# Patient Record
Sex: Female | Born: 1990 | Race: Black or African American | Hispanic: No | Marital: Married | Smoking: Never smoker
Health system: Southern US, Community
[De-identification: ages and names within clinical notes are randomized; demographics above are authoritative.]

## PROBLEM LIST (undated history)

## (undated) ENCOUNTER — Emergency Department (HOSPITAL_COMMUNITY): Admission: EM | Payer: 59 | Source: Home / Self Care

## (undated) ENCOUNTER — Inpatient Hospital Stay (HOSPITAL_COMMUNITY): Payer: Self-pay

## (undated) DIAGNOSIS — I890 Lymphedema, not elsewhere classified: Secondary | ICD-10-CM

## (undated) HISTORY — PX: NO PAST SURGERIES: SHX2092

---

## 2020-12-16 ENCOUNTER — Other Ambulatory Visit: Payer: Self-pay | Admitting: Obstetrics and Gynecology

## 2020-12-16 DIAGNOSIS — O3680X0 Pregnancy with inconclusive fetal viability, not applicable or unspecified: Secondary | ICD-10-CM

## 2020-12-23 ENCOUNTER — Other Ambulatory Visit: Payer: Self-pay

## 2020-12-23 ENCOUNTER — Ambulatory Visit
Admission: RE | Admit: 2020-12-23 | Discharge: 2020-12-23 | Disposition: A | Discharge status: Home / Self Care | Payer: 59 | Source: Ambulatory Visit | Attending: Obstetrics and Gynecology | Admitting: Obstetrics and Gynecology

## 2020-12-23 DIAGNOSIS — O3680X0 Pregnancy with inconclusive fetal viability, not applicable or unspecified: Secondary | ICD-10-CM | POA: Diagnosis not present

## 2021-01-11 ENCOUNTER — Other Ambulatory Visit: Payer: Self-pay | Admitting: Obstetrics and Gynecology

## 2021-01-11 DIAGNOSIS — O3680X0 Pregnancy with inconclusive fetal viability, not applicable or unspecified: Secondary | ICD-10-CM

## 2021-01-19 ENCOUNTER — Other Ambulatory Visit: Payer: Self-pay

## 2021-01-19 ENCOUNTER — Ambulatory Visit
Admission: RE | Admit: 2021-01-19 | Discharge: 2021-01-19 | Disposition: A | Discharge status: Home / Self Care | Payer: 59 | Source: Ambulatory Visit | Attending: Obstetrics and Gynecology | Admitting: Obstetrics and Gynecology

## 2021-01-19 DIAGNOSIS — O3680X0 Pregnancy with inconclusive fetal viability, not applicable or unspecified: Secondary | ICD-10-CM | POA: Diagnosis present

## 2021-02-01 LAB — OB RESULTS CONSOLE HEPATITIS B SURFACE ANTIGEN: Hepatitis B Surface Ag: NEGATIVE

## 2021-02-01 LAB — OB RESULTS CONSOLE GC/CHLAMYDIA
Chlamydia: NEGATIVE
Gonorrhea: NEGATIVE

## 2021-02-01 LAB — OB RESULTS CONSOLE ANTIBODY SCREEN: Antibody Screen: NEGATIVE

## 2021-02-01 LAB — OB RESULTS CONSOLE HIV ANTIBODY (ROUTINE TESTING): HIV: NONREACTIVE

## 2021-02-01 LAB — OB RESULTS CONSOLE RUBELLA ANTIBODY, IGM: Rubella: IMMUNE

## 2021-02-01 LAB — OB RESULTS CONSOLE ABO/RH: RH Type: POSITIVE

## 2021-02-01 LAB — HEPATITIS C ANTIBODY: HCV Ab: NEGATIVE

## 2021-06-02 LAB — OB RESULTS CONSOLE RPR: RPR: NONREACTIVE

## 2021-08-15 ENCOUNTER — Encounter (HOSPITAL_COMMUNITY): Payer: Self-pay | Admitting: Obstetrics and Gynecology

## 2021-08-15 ENCOUNTER — Inpatient Hospital Stay (HOSPITAL_COMMUNITY)
Admission: AD | Admit: 2021-08-15 | Discharge: 2021-08-15 | Disposition: A | Discharge status: Home / Self Care | Payer: 59 | Attending: Obstetrics and Gynecology | Admitting: Obstetrics and Gynecology

## 2021-08-15 ENCOUNTER — Other Ambulatory Visit: Payer: Self-pay

## 2021-08-15 DIAGNOSIS — Z3A39 39 weeks gestation of pregnancy: Secondary | ICD-10-CM | POA: Diagnosis not present

## 2021-08-15 DIAGNOSIS — O471 False labor at or after 37 completed weeks of gestation: Secondary | ICD-10-CM | POA: Diagnosis present

## 2021-08-15 HISTORY — DX: Lymphedema, not elsewhere classified: I89.0

## 2021-08-15 NOTE — MAU Provider Note (Signed)
History     Chief Complaint  Patient presents with   Contractions   30 yo F @ 39 5/[redacted] weeks gestation presents for labor check (+) ctx   OB History     Gravida  1   Para      Term      Preterm      AB      Living         SAB      IAB      Ectopic      Multiple      Live Births              No past medical history on file.   No family history on file.     Allergies: Not on File  No medications prior to admission.     Physical Exam   Blood pressure 112/88, pulse 91, SpO2 99 %.  VE per RN  3/70/-1 Tracing reviewed: baseline 135 (+) accel  165 Ctx q 2-5 mins ED Course  IMP: latent phase labor Term P) d/c home. Labor precautions MDM   Serita Kyle, MD 4:19 AM 08/15/2021

## 2021-08-15 NOTE — MAU Note (Signed)
Helen Shah is a 30 y.o. at [redacted]w[redacted]d here in MAU reporting: contractions that began at 43. Denies SROM, vaginal bleeding or bloody show. Endorses + fetal movement  Onset of complaint: 0022 Pain score:5 There were no vitals filed for this visit.   FHT 135 Lab orders placed from triage:  mau labor

## 2021-08-15 NOTE — Discharge Instructions (Signed)
Labor precautions

## 2021-08-15 NOTE — MAU Note (Signed)
Pt reports contractions feel less frequent but remain at the same level of intensity.

## 2021-08-20 ENCOUNTER — Inpatient Hospital Stay (HOSPITAL_COMMUNITY): Payer: 59 | Admitting: Anesthesiology

## 2021-08-20 ENCOUNTER — Encounter (HOSPITAL_COMMUNITY): Payer: Self-pay | Admitting: Obstetrics and Gynecology

## 2021-08-20 ENCOUNTER — Encounter (HOSPITAL_COMMUNITY)
Admission: AD | Disposition: A | Discharge status: Home / Self Care | Payer: Self-pay | Source: Home / Self Care | Attending: Obstetrics and Gynecology

## 2021-08-20 ENCOUNTER — Other Ambulatory Visit: Payer: Self-pay

## 2021-08-20 ENCOUNTER — Inpatient Hospital Stay (HOSPITAL_COMMUNITY)
Admission: RE | Admit: 2021-08-20 | Discharge: 2021-08-20 | Disposition: A | Discharge status: Home / Self Care | Payer: 59 | Source: Home / Self Care | Attending: Obstetrics and Gynecology | Admitting: Obstetrics and Gynecology

## 2021-08-20 ENCOUNTER — Inpatient Hospital Stay (HOSPITAL_COMMUNITY)
Admission: AD | Admit: 2021-08-20 | Discharge: 2021-08-22 | DRG: 786 | Disposition: A | Discharge status: Home / Self Care | Payer: 59 | Attending: Obstetrics and Gynecology | Admitting: Obstetrics and Gynecology

## 2021-08-20 DIAGNOSIS — O471 False labor at or after 37 completed weeks of gestation: Secondary | ICD-10-CM | POA: Insufficient documentation

## 2021-08-20 DIAGNOSIS — U071 COVID-19: Secondary | ICD-10-CM | POA: Diagnosis present

## 2021-08-20 DIAGNOSIS — O26893 Other specified pregnancy related conditions, third trimester: Secondary | ICD-10-CM | POA: Diagnosis present

## 2021-08-20 DIAGNOSIS — O9081 Anemia of the puerperium: Secondary | ICD-10-CM | POA: Diagnosis not present

## 2021-08-20 DIAGNOSIS — Z3A40 40 weeks gestation of pregnancy: Secondary | ICD-10-CM

## 2021-08-20 DIAGNOSIS — O480 Post-term pregnancy: Principal | ICD-10-CM | POA: Diagnosis present

## 2021-08-20 DIAGNOSIS — O9852 Other viral diseases complicating childbirth: Secondary | ICD-10-CM | POA: Diagnosis present

## 2021-08-20 DIAGNOSIS — O99214 Obesity complicating childbirth: Secondary | ICD-10-CM | POA: Diagnosis present

## 2021-08-20 DIAGNOSIS — D62 Acute posthemorrhagic anemia: Secondary | ICD-10-CM | POA: Diagnosis not present

## 2021-08-20 LAB — CBC
HCT: 40.5 % (ref 36.0–46.0)
Hemoglobin: 13.8 g/dL (ref 12.0–15.0)
MCH: 32.5 pg (ref 26.0–34.0)
MCHC: 34.1 g/dL (ref 30.0–36.0)
MCV: 95.5 fL (ref 80.0–100.0)
Platelets: 216 10*3/uL (ref 150–400)
RBC: 4.24 MIL/uL (ref 3.87–5.11)
RDW: 13.5 % (ref 11.5–15.5)
WBC: 13.4 10*3/uL — ABNORMAL HIGH (ref 4.0–10.5)
nRBC: 0 % (ref 0.0–0.2)

## 2021-08-20 LAB — TYPE AND SCREEN
ABO/RH(D): B POS
Antibody Screen: NEGATIVE

## 2021-08-20 LAB — RESP PANEL BY RT-PCR (FLU A&B, COVID) ARPGX2
Influenza A by PCR: NEGATIVE
Influenza B by PCR: NEGATIVE
SARS Coronavirus 2 by RT PCR: POSITIVE — AB

## 2021-08-20 SURGERY — Surgical Case
Anesthesia: Epidural

## 2021-08-20 MED ORDER — PHENYLEPHRINE 40 MCG/ML (10ML) SYRINGE FOR IV PUSH (FOR BLOOD PRESSURE SUPPORT)
PREFILLED_SYRINGE | INTRAVENOUS | Status: AC
  Filled 2021-08-20: qty 10

## 2021-08-20 MED ORDER — SODIUM CHLORIDE 0.9% FLUSH: 3.0000 mL | INTRAVENOUS | Status: DC | PRN

## 2021-08-20 MED ORDER — DEXAMETHASONE SODIUM PHOSPHATE 4 MG/ML IJ SOLN
INTRAMUSCULAR | Status: DC | PRN
  Administered 2021-08-20: 4 mg via INTRAVENOUS

## 2021-08-20 MED ORDER — DIPHENHYDRAMINE HCL 25 MG PO CAPS: 25.0000 mg | ORAL_CAPSULE | ORAL | Status: DC | PRN

## 2021-08-20 MED ORDER — OXYTOCIN BOLUS FROM INFUSION: 333.0000 mL | Freq: Once | INTRAVENOUS | Status: DC

## 2021-08-20 MED ORDER — SODIUM CHLORIDE 0.9 % IR SOLN
Status: DC | PRN
  Administered 2021-08-20: 1

## 2021-08-20 MED ORDER — DIPHENHYDRAMINE HCL 50 MG/ML IJ SOLN: 12.5000 mg | INTRAMUSCULAR | Status: DC | PRN

## 2021-08-20 MED ORDER — LIDOCAINE HCL (PF) 1 % IJ SOLN: 30.0000 mL | INTRAMUSCULAR | Status: DC | PRN

## 2021-08-20 MED ORDER — DIPHENHYDRAMINE HCL 25 MG PO CAPS: 25.0000 mg | ORAL_CAPSULE | Freq: Four times a day (QID) | ORAL | Status: DC | PRN

## 2021-08-20 MED ORDER — DIBUCAINE (PERIANAL) 1 % EX OINT: 1.0000 "application " | TOPICAL_OINTMENT | CUTANEOUS | Status: DC | PRN

## 2021-08-20 MED ORDER — LIDOCAINE HCL (PF) 1 % IJ SOLN
INTRAMUSCULAR | Status: DC | PRN
  Administered 2021-08-20 (×2): 5 mL via EPIDURAL

## 2021-08-20 MED ORDER — PRENATAL MULTIVITAMIN CH
1.0000 | ORAL_TABLET | Freq: Every day | ORAL | Status: DC
  Administered 2021-08-21 – 2021-08-22 (×2): 1 via ORAL
  Filled 2021-08-20 (×2): qty 1

## 2021-08-20 MED ORDER — KETOROLAC TROMETHAMINE 30 MG/ML IJ SOLN
30.0000 mg | Freq: Four times a day (QID) | INTRAMUSCULAR | Status: AC | PRN
  Administered 2021-08-20 – 2021-08-21 (×2): 30 mg via INTRAVENOUS
  Filled 2021-08-20 (×2): qty 1

## 2021-08-20 MED ORDER — OXYTOCIN-SODIUM CHLORIDE 30-0.9 UT/500ML-% IV SOLN
INTRAVENOUS | Status: DC | PRN
  Administered 2021-08-20: 200 mL via INTRAVENOUS

## 2021-08-20 MED ORDER — OXYCODONE-ACETAMINOPHEN 5-325 MG PO TABS: 1.0000 | ORAL_TABLET | ORAL | Status: DC | PRN

## 2021-08-20 MED ORDER — OXYTOCIN-SODIUM CHLORIDE 30-0.9 UT/500ML-% IV SOLN
1.0000 m[IU]/min | INTRAVENOUS | Status: DC
  Administered 2021-08-20: 14:00:00 2 m[IU]/min via INTRAVENOUS
  Filled 2021-08-20: qty 500

## 2021-08-20 MED ORDER — ONDANSETRON HCL 4 MG/2ML IJ SOLN
INTRAMUSCULAR | Status: AC
  Filled 2021-08-20: qty 4

## 2021-08-20 MED ORDER — SENNOSIDES-DOCUSATE SODIUM 8.6-50 MG PO TABS
2.0000 | ORAL_TABLET | ORAL | Status: DC
  Administered 2021-08-21: 22:00:00 2 via ORAL
  Filled 2021-08-20: qty 2

## 2021-08-20 MED ORDER — PHENYLEPHRINE 40 MCG/ML (10ML) SYRINGE FOR IV PUSH (FOR BLOOD PRESSURE SUPPORT): 80.0000 ug | PREFILLED_SYRINGE | INTRAVENOUS | Status: DC | PRN

## 2021-08-20 MED ORDER — IBUPROFEN 600 MG PO TABS
600.0000 mg | ORAL_TABLET | Freq: Four times a day (QID) | ORAL | Status: DC
  Administered 2021-08-21 – 2021-08-22 (×5): 600 mg via ORAL
  Filled 2021-08-20 (×5): qty 1

## 2021-08-20 MED ORDER — ACETAMINOPHEN 10 MG/ML IV SOLN
INTRAVENOUS | Status: AC
  Filled 2021-08-20: qty 100

## 2021-08-20 MED ORDER — OXYCODONE HCL 5 MG PO TABS
5.0000 mg | ORAL_TABLET | ORAL | Status: DC | PRN
  Administered 2021-08-21 – 2021-08-22 (×3): 5 mg via ORAL
  Filled 2021-08-20 (×3): qty 1

## 2021-08-20 MED ORDER — SIMETHICONE 80 MG PO CHEW
80.0000 mg | CHEWABLE_TABLET | Freq: Three times a day (TID) | ORAL | Status: DC
  Administered 2021-08-21 – 2021-08-22 (×3): 80 mg via ORAL
  Filled 2021-08-20 (×3): qty 1

## 2021-08-20 MED ORDER — LACTATED RINGERS IV SOLN: 500.0000 mL | INTRAVENOUS | Status: DC | PRN

## 2021-08-20 MED ORDER — SIMETHICONE 80 MG PO CHEW: 80.0000 mg | CHEWABLE_TABLET | ORAL | Status: DC | PRN

## 2021-08-20 MED ORDER — CEFAZOLIN SODIUM-DEXTROSE 2-4 GM/100ML-% IV SOLN
INTRAVENOUS | Status: AC
  Filled 2021-08-20: qty 100

## 2021-08-20 MED ORDER — ONDANSETRON HCL 4 MG/2ML IJ SOLN: 4.0000 mg | Freq: Four times a day (QID) | INTRAMUSCULAR | Status: DC | PRN

## 2021-08-20 MED ORDER — ACETAMINOPHEN 500 MG PO TABS
1000.0000 mg | ORAL_TABLET | Freq: Four times a day (QID) | ORAL | Status: DC
  Administered 2021-08-21 – 2021-08-22 (×6): 1000 mg via ORAL
  Filled 2021-08-20 (×6): qty 2

## 2021-08-20 MED ORDER — TERBUTALINE SULFATE 1 MG/ML IJ SOLN: 0.2500 mg | Freq: Once | INTRAMUSCULAR | Status: DC | PRN

## 2021-08-20 MED ORDER — LIDOCAINE-EPINEPHRINE (PF) 2 %-1:200000 IJ SOLN
INTRAMUSCULAR | Status: AC
  Filled 2021-08-20: qty 20

## 2021-08-20 MED ORDER — FENTANYL CITRATE (PF) 100 MCG/2ML IJ SOLN
100.0000 ug | Freq: Once | INTRAMUSCULAR | Status: AC
  Administered 2021-08-20: 12:00:00 100 ug via INTRAVENOUS

## 2021-08-20 MED ORDER — SOD CITRATE-CITRIC ACID 500-334 MG/5ML PO SOLN
30.0000 mL | ORAL | Status: DC | PRN
  Administered 2021-08-20: 16:00:00 30 mL via ORAL
  Filled 2021-08-20: qty 30

## 2021-08-20 MED ORDER — MORPHINE SULFATE (PF) 0.5 MG/ML IJ SOLN
INTRAMUSCULAR | Status: AC
  Filled 2021-08-20: qty 10

## 2021-08-20 MED ORDER — MENTHOL 3 MG MT LOZG: 1.0000 | LOZENGE | OROMUCOSAL | Status: DC | PRN

## 2021-08-20 MED ORDER — PHENYLEPHRINE HCL (PRESSORS) 10 MG/ML IV SOLN
INTRAVENOUS | Status: DC | PRN
  Administered 2021-08-20 (×2): 160 ug via INTRAVENOUS

## 2021-08-20 MED ORDER — NALOXONE HCL 0.4 MG/ML IJ SOLN: 0.4000 mg | INTRAMUSCULAR | Status: DC | PRN

## 2021-08-20 MED ORDER — OXYTOCIN-SODIUM CHLORIDE 30-0.9 UT/500ML-% IV SOLN
2.5000 [IU]/h | INTRAVENOUS | Status: AC
  Administered 2021-08-21: 02:00:00 2.5 [IU]/h via INTRAVENOUS
  Filled 2021-08-20: qty 500

## 2021-08-20 MED ORDER — ACETAMINOPHEN 10 MG/ML IV SOLN
INTRAVENOUS | Status: DC | PRN
  Administered 2021-08-20: 1000 mg via INTRAVENOUS

## 2021-08-20 MED ORDER — LACTATED RINGERS IV SOLN: INTRAVENOUS | Status: DC

## 2021-08-20 MED ORDER — OXYTOCIN 10 UNIT/ML IJ SOLN: 10.0000 [IU] | Freq: Once | INTRAMUSCULAR | Status: DC

## 2021-08-20 MED ORDER — DEXAMETHASONE SODIUM PHOSPHATE 4 MG/ML IJ SOLN
INTRAMUSCULAR | Status: AC
  Filled 2021-08-20: qty 1

## 2021-08-20 MED ORDER — ACETAMINOPHEN 325 MG PO TABS: 650.0000 mg | ORAL_TABLET | ORAL | Status: DC | PRN

## 2021-08-20 MED ORDER — COCONUT OIL OIL: 1.0000 "application " | TOPICAL_OIL | Status: DC | PRN

## 2021-08-20 MED ORDER — WITCH HAZEL-GLYCERIN EX PADS: 1.0000 "application " | MEDICATED_PAD | CUTANEOUS | Status: DC | PRN

## 2021-08-20 MED ORDER — FENTANYL CITRATE (PF) 100 MCG/2ML IJ SOLN: 25.0000 ug | INTRAMUSCULAR | Status: DC | PRN

## 2021-08-20 MED ORDER — OXYCODONE-ACETAMINOPHEN 5-325 MG PO TABS
2.0000 | ORAL_TABLET | ORAL | Status: DC | PRN
Start: 2021-08-20 — End: 2021-08-20

## 2021-08-20 MED ORDER — KETOROLAC TROMETHAMINE 30 MG/ML IJ SOLN
INTRAMUSCULAR | Status: AC
  Filled 2021-08-20: qty 1

## 2021-08-20 MED ORDER — OXYTOCIN-SODIUM CHLORIDE 30-0.9 UT/500ML-% IV SOLN: 2.5000 [IU]/h | INTRAVENOUS | Status: DC

## 2021-08-20 MED ORDER — OXYTOCIN-SODIUM CHLORIDE 30-0.9 UT/500ML-% IV SOLN
INTRAVENOUS | Status: AC
  Filled 2021-08-20: qty 500

## 2021-08-20 MED ORDER — FENTANYL-BUPIVACAINE-NACL 0.5-0.125-0.9 MG/250ML-% EP SOLN
EPIDURAL | Status: AC
  Filled 2021-08-20: qty 250

## 2021-08-20 MED ORDER — ONDANSETRON HCL 4 MG/2ML IJ SOLN
4.0000 mg | Freq: Three times a day (TID) | INTRAMUSCULAR | Status: DC | PRN
  Administered 2021-08-20 – 2021-08-21 (×2): 4 mg via INTRAVENOUS
  Filled 2021-08-20 (×3): qty 2

## 2021-08-20 MED ORDER — ZOLPIDEM TARTRATE 5 MG PO TABS: 5.0000 mg | ORAL_TABLET | Freq: Every evening | ORAL | Status: DC | PRN

## 2021-08-20 MED ORDER — KETOROLAC TROMETHAMINE 30 MG/ML IJ SOLN
30.0000 mg | Freq: Four times a day (QID) | INTRAMUSCULAR | Status: AC | PRN
  Administered 2021-08-20: 18:00:00 30 mg via INTRAMUSCULAR

## 2021-08-20 MED ORDER — ONDANSETRON HCL 4 MG/2ML IJ SOLN
INTRAMUSCULAR | Status: DC | PRN
  Administered 2021-08-20: 4 mg via INTRAVENOUS

## 2021-08-20 MED ORDER — EPHEDRINE 5 MG/ML INJ: 10.0000 mg | INTRAVENOUS | Status: DC | PRN

## 2021-08-20 MED ORDER — MEPERIDINE HCL 25 MG/ML IJ SOLN: 6.2500 mg | INTRAMUSCULAR | Status: DC | PRN

## 2021-08-20 MED ORDER — LIDOCAINE-EPINEPHRINE (PF) 2 %-1:200000 IJ SOLN
INTRAMUSCULAR | Status: DC | PRN
  Administered 2021-08-20 (×2): 5 mL via EPIDURAL

## 2021-08-20 MED ORDER — INFLUENZA VAC SPLIT QUAD 0.5 ML IM SUSY: 0.5000 mL | PREFILLED_SYRINGE | INTRAMUSCULAR | Status: DC

## 2021-08-20 MED ORDER — BUPIVACAINE HCL (PF) 0.25 % IJ SOLN
INTRAMUSCULAR | Status: DC | PRN
  Administered 2021-08-20: 10 mL

## 2021-08-20 MED ORDER — NALOXONE HCL 4 MG/10ML IJ SOLN
1.0000 ug/kg/h | INTRAVENOUS | Status: DC | PRN
  Filled 2021-08-20: qty 5

## 2021-08-20 MED ORDER — LACTATED RINGERS IV SOLN: 500.0000 mL | Freq: Once | INTRAVENOUS | Status: DC

## 2021-08-20 MED ORDER — FENTANYL-BUPIVACAINE-NACL 0.5-0.125-0.9 MG/250ML-% EP SOLN
12.0000 mL/h | EPIDURAL | Status: DC | PRN
  Administered 2021-08-20: 13:00:00 11 mL/h via EPIDURAL

## 2021-08-20 MED ORDER — MORPHINE SULFATE (PF) 0.5 MG/ML IJ SOLN
INTRAMUSCULAR | Status: DC | PRN
  Administered 2021-08-20: 3 mg via EPIDURAL

## 2021-08-20 MED ORDER — BUPIVACAINE HCL (PF) 0.25 % IJ SOLN
INTRAMUSCULAR | Status: AC
  Filled 2021-08-20: qty 30

## 2021-08-20 MED ORDER — CEFAZOLIN SODIUM-DEXTROSE 2-3 GM-%(50ML) IV SOLR
INTRAVENOUS | Status: DC | PRN
  Administered 2021-08-20: 2 g via INTRAVENOUS

## 2021-08-20 MED ORDER — FENTANYL CITRATE (PF) 100 MCG/2ML IJ SOLN
INTRAMUSCULAR | Status: AC
  Filled 2021-08-20: qty 2

## 2021-08-20 SURGICAL SUPPLY — 45 items
BARRIER ADHS 3X4 INTERCEED (GAUZE/BANDAGES/DRESSINGS) ×5 IMPLANT
BENZOIN TINCTURE PRP APPL 2/3 (GAUZE/BANDAGES/DRESSINGS) ×2 IMPLANT
CHLORAPREP W/TINT 26ML (MISCELLANEOUS) ×3 IMPLANT
CLAMP CORD UMBIL (MISCELLANEOUS) IMPLANT
CLOSURE STERI STRIP 1/2 X4 (GAUZE/BANDAGES/DRESSINGS) ×2 IMPLANT
CLOSURE WOUND 1/2 X4 (GAUZE/BANDAGES/DRESSINGS)
CLOTH BEACON ORANGE TIMEOUT ST (SAFETY) ×3 IMPLANT
DRAPE C SECTION CLR SCREEN (DRAPES) ×3 IMPLANT
DRSG OPSITE POSTOP 4X10 (GAUZE/BANDAGES/DRESSINGS) ×3 IMPLANT
ELECT REM PT RETURN 9FT ADLT (ELECTROSURGICAL) ×3
ELECTRODE REM PT RTRN 9FT ADLT (ELECTROSURGICAL) ×1 IMPLANT
EXTRACTOR VACUUM M CUP 4 TUBE (SUCTIONS) IMPLANT
EXTRACTOR VACUUM M CUP 4' TUBE (SUCTIONS)
GLOVE BIOGEL PI IND STRL 7.0 (GLOVE) ×2 IMPLANT
GLOVE BIOGEL PI INDICATOR 7.0 (GLOVE) ×4
GLOVE ECLIPSE 6.5 STRL STRAW (GLOVE) ×3 IMPLANT
GOWN STRL REUS W/TWL LRG LVL3 (GOWN DISPOSABLE) ×6 IMPLANT
KIT ABG SYR 3ML LUER SLIP (SYRINGE) IMPLANT
NDL HYPO 25X5/8 SAFETYGLIDE (NEEDLE) IMPLANT
NEEDLE HYPO 22GX1.5 SAFETY (NEEDLE) ×3 IMPLANT
NEEDLE HYPO 25X5/8 SAFETYGLIDE (NEEDLE) IMPLANT
NS IRRIG 1000ML POUR BTL (IV SOLUTION) ×3 IMPLANT
PACK C SECTION WH (CUSTOM PROCEDURE TRAY) ×3 IMPLANT
PAD OB MATERNITY 4.3X12.25 (PERSONAL CARE ITEMS) ×3 IMPLANT
RTRCTR C-SECT PINK 25CM LRG (MISCELLANEOUS) IMPLANT
STRIP CLOSURE SKIN 1/2X4 (GAUZE/BANDAGES/DRESSINGS) IMPLANT
SUT CHROMIC GUT AB #0 18 (SUTURE) IMPLANT
SUT MNCRL 0 VIOLET CTX 36 (SUTURE) ×3 IMPLANT
SUT MON AB 2-0 SH 27 (SUTURE)
SUT MON AB 2-0 SH27 (SUTURE) IMPLANT
SUT MON AB 3-0 SH 27 (SUTURE)
SUT MON AB 3-0 SH27 (SUTURE) IMPLANT
SUT MON AB 4-0 PS1 27 (SUTURE) IMPLANT
SUT MONOCRYL 0 CTX 36 (SUTURE) ×6
SUT PLAIN 2 0 (SUTURE)
SUT PLAIN 2 0 XLH (SUTURE) IMPLANT
SUT PLAIN ABS 2-0 CT1 27XMFL (SUTURE) IMPLANT
SUT VIC AB 0 CT1 36 (SUTURE) ×6 IMPLANT
SUT VIC AB 2-0 CT1 27 (SUTURE) ×2
SUT VIC AB 2-0 CT1 TAPERPNT 27 (SUTURE) ×1 IMPLANT
SUT VIC AB 4-0 PS2 27 (SUTURE) IMPLANT
SYR CONTROL 10ML LL (SYRINGE) ×3 IMPLANT
TOWEL OR 17X24 6PK STRL BLUE (TOWEL DISPOSABLE) ×3 IMPLANT
TRAY FOLEY W/BAG SLVR 14FR LF (SET/KITS/TRAYS/PACK) IMPLANT
WATER STERILE IRR 1000ML POUR (IV SOLUTION) ×3 IMPLANT

## 2021-08-20 NOTE — Anesthesia Procedure Notes (Signed)
Epidural Patient location during procedure: OB Start time: 08/20/2021 1:11 PM End time: 08/20/2021 1:19 PM  Staffing Anesthesiologist: Mal Amabile, MD Performed: anesthesiologist   Preanesthetic Checklist Completed: patient identified, IV checked, site marked, risks and benefits discussed, surgical consent, monitors and equipment checked, pre-op evaluation and timeout performed  Epidural Patient position: sitting Prep: DuraPrep and site prepped and draped Patient monitoring: continuous pulse ox and blood pressure Approach: midline Location: L4-L5 Injection technique: LOR air  Needle:  Needle type: Tuohy  Needle gauge: 17 G Needle length: 9 cm and 9 Needle insertion depth: 5 cm Catheter type: closed end flexible Catheter size: 19 Gauge Catheter at skin depth: 11 cm Test dose: negative and Other  Assessment Events: blood not aspirated, injection not painful, no injection resistance, no paresthesia and negative IV test  Additional Notes Patient identified. Risks and benefits discussed including failed block, incomplete  Pain control, post dural puncture headache, nerve damage, paralysis, blood pressure Changes, nausea, vomiting, reactions to medications-both toxic and allergic and post Partum back pain. All questions were answered. Patient expressed understanding and wished to proceed. Sterile technique was used throughout procedure. Epidural site was Dressed with sterile barrier dressing. No paresthesias, signs of intravascular injection Or signs of intrathecal spread were encountered.  Patient was more comfortable after the epidural was dosed. Please see RN's note for documentation of vital signs and FHR which are stable. Reason for block:procedure for pain

## 2021-08-20 NOTE — Brief Op Note (Signed)
08/20/2021  5:43 PM  PATIENT:  Helen Shah  30 y.o. female  PRE-OPERATIVE DIAGNOSIS:  nonreassuring fetal tracing, postdates, Covid 19 positive  POST-OPERATIVE DIAGNOSIS:  same  PROCEDURE:  Primary Cesarean section, kerr hysterotomy  SURGEON:  Surgeon(s) and Role:    * Gaylyn Berish, MD - Primary  PHYSICIAN ASSISTANT:   ASSISTANTS: none   ANESTHESIA:   epidural FINDINGS: Live female LOT, nuchal and loop of cord next to chest, right fist/hand( compound), nl tubes and ovaries EBL:  400 mL   BLOOD ADMINISTERED:none  DRAINS: none   LOCAL MEDICATIONS USED:  MARCAINE     SPECIMEN:  Source of Specimen:  placenta  DISPOSITION OF SPECIMEN:  N/A  COUNTS:  YES  TOURNIQUET:  * No tourniquets in log *  DICTATION: .Other Dictation: Dictation Number [id number]  PLAN OF CARE: Admit to inpatient   PATIENT DISPOSITION:  PACU - hemodynamically stable.   Delay start of Pharmacological VTE agent (>24hrs) due to surgical blood loss or risk of bleeding: no

## 2021-08-20 NOTE — MAU Note (Signed)
Has continued to contract since she left last night. Have gotten stronger and closer. No bleeding or leaking.

## 2021-08-20 NOTE — Transfer of Care (Signed)
Immediate Anesthesia Transfer of Care Note  Patient: Helen Shah  Procedure(s) Performed: CESAREAN SECTION  Patient Location: PACU  Anesthesia Type:Epidural  Level of Consciousness: awake, alert  and patient cooperative  Airway & Oxygen Therapy: Patient Spontanous Breathing  Post-op Assessment: Report given to RN and Post -op Vital signs reviewed and stable  Post vital signs: Reviewed and stable  Last Vitals:  Vitals Value Taken Time  BP 110/88 08/20/21 1912  Temp 36.8 C 08/20/21 1912  Pulse 122 08/20/21 1912  Resp 18 08/20/21 1912  SpO2 100 % 08/20/21 1912    Last Pain:  Vitals:   08/20/21 1912  TempSrc: Oral  PainSc:          Complications: No notable events documented.

## 2021-08-20 NOTE — Progress Notes (Signed)
Epidural Pitocin 4 miu Tracing noted: baseline 145 (+) repetitive late decelerations Ctx q 2-3 1/2 min  IMp: repetitive late deceleration: non reassuring  P) proceed with C/S. Pt and husband informed  Procedure explained. Risk of surgery reviewed Including infection, bleeding,possible need for Blood transfusion and its risk( HIV, acute rxn, hepatitis) Injury to bladder, bowels, ureter, internal scar tissue All ? answered

## 2021-08-20 NOTE — Anesthesia Postprocedure Evaluation (Signed)
Anesthesia Post Note  Patient: Helen Shah  Procedure(s) Performed: CESAREAN SECTION     Patient location during evaluation: PACU Anesthesia Type: Epidural Level of consciousness: oriented and awake and alert Pain management: pain level controlled Vital Signs Assessment: post-procedure vital signs reviewed and stable Respiratory status: spontaneous breathing, respiratory function stable and nonlabored ventilation Cardiovascular status: blood pressure returned to baseline and stable Postop Assessment: no headache, no backache, no apparent nausea or vomiting, epidural receding and patient able to bend at knees Anesthetic complications: no   No notable events documented.  Last Vitals:  Vitals:   08/20/21 1815 08/20/21 1830  BP: 105/88 113/62  Pulse: (!) 110 94  Resp: 17 15  Temp:    SpO2: 100% 100%    Last Pain:  Vitals:   08/20/21 1830  TempSrc:   PainSc: 0-No pain   Pain Goal:    LLE Motor Response: Purposeful movement (08/20/21 1800) LLE Sensation: Decreased (08/20/21 1800) RLE Motor Response: Purposeful movement (08/20/21 1800) RLE Sensation: Decreased (08/20/21 1800)     Epidural/Spinal Function Cutaneous sensation: Able to Wiggle Toes (08/20/21 1800), Patient able to flex knees: No (08/20/21 1800), Patient able to lift hips off bed: No (08/20/21 1800), Back pain beyond tenderness at insertion site: No (08/20/21 1800), Progressively worsening motor and/or sensory loss: No (08/20/21 1800), Bowel and/or bladder incontinence post epidural: No (08/20/21 1800)  Helen Shah A.

## 2021-08-20 NOTE — Progress Notes (Signed)
Called Dr. Cherly Hensen to ask for a scope patch due to pt complaining of nausea. Pt received Zofran @ 1703. Dr. Cherly Hensen said to give an additional dose of Zofran. Will continue to monitor.

## 2021-08-20 NOTE — Anesthesia Preprocedure Evaluation (Addendum)
Anesthesia Evaluation  Patient identified by MRN, date of birth, ID band Patient awake    Reviewed: Allergy & Precautions, NPO status , Patient's Chart, lab work & pertinent test results  Airway Mallampati: II  TM Distance: >3 FB Neck ROM: Full    Dental no notable dental hx.    Pulmonary  Covid 19 +   Pulmonary exam normal        Cardiovascular + Peripheral Vascular Disease  Normal cardiovascular exam  Lymphedema   Neuro/Psych negative neurological ROS  negative psych ROS   GI/Hepatic Neg liver ROS, GERD  ,  Endo/Other  Obesity  Renal/GU negative Renal ROS  negative genitourinary   Musculoskeletal negative musculoskeletal ROS (+)   Abdominal (+) + obese,   Peds  Hematology   Anesthesia Other Findings   Reproductive/Obstetrics (+) Pregnancy                            Anesthesia Physical Anesthesia Plan  ASA: 2 and emergent  Anesthesia Plan: Epidural   Post-op Pain Management:    Induction:   PONV Risk Score and Plan:   Airway Management Planned: Natural Airway  Additional Equipment:   Intra-op Plan:   Post-operative Plan:   Informed Consent: I have reviewed the patients History and Physical, chart, labs and discussed the procedure including the risks, benefits and alternatives for the proposed anesthesia with the patient or authorized representative who has indicated his/her understanding and acceptance.       Plan Discussed with: Anesthesiologist and CRNA  Anesthesia Plan Comments: (Patient for C/Section for non reassuring FHR tracing. Will use epidural for C/Section. M. Malen Gauze, MD)       Anesthesia Quick Evaluation

## 2021-08-20 NOTE — H&P (Signed)
Helen Shah is a 30 y.o. female presenting @ 40 3/[redacted] week gestation  presented in  labor. GBS cx negative. Intact membrane. OB History     Gravida  2   Para  1   Term      Preterm      AB      Living  1      SAB      IAB      Ectopic      Multiple      Live Births             Past Medical History:  Diagnosis Date   Lymphedema    Peripheral vascular disease (HCC)    Past Surgical History:  Procedure Laterality Date   NO PAST SURGERIES     Family History: family history is not on file. Social History:  reports that she has never smoked. She has never used smokeless tobacco. She reports that she does not drink alcohol and does not use drugs.     Maternal Diabetes: No Genetic Screening: Normal Maternal Ultrasounds/Referrals: Normal Fetal Ultrasounds or other Referrals:  None Maternal Substance Abuse:  No Significant Maternal Medications:  None Significant Maternal Lab Results:  Group B Strep negative Other Comments:   none  Review of Systems  All other systems reviewed and are negative. History Dilation: 5.5 Effacement (%): 70 Station: -2 Exam by:: s grindstaff rn Blood pressure 111/82, pulse 77, temperature 98.6 F (37 C), temperature source Axillary, resp. rate 16, height 5\' 2"  (1.575 m), weight 76.2 kg, SpO2 100 %. Exam Physical Exam Constitutional:      Appearance: Normal appearance.  Eyes:     Extraocular Movements: Extraocular movements intact.  Cardiovascular:     Rate and Rhythm: Regular rhythm.  Pulmonary:     Breath sounds: Normal breath sounds.  Abdominal:     Comments: gravid  Musculoskeletal:     Cervical back: Neck supple.  Skin:    General: Skin is dry.  Neurological:     Mental Status: She is oriented to person, place, and time.  Psychiatric:        Mood and Affect: Mood normal.        Behavior: Behavior normal.   AROM clear fluid IUPC placed  Prenatal labs: ABO, Rh: --/--/B POS (12/24 1155) Antibody: NEG (12/24  1155) Rubella: Immune (06/07 0000) RPR: Nonreactive (10/06 0000)  HBsAg: Negative (06/07 0000)  HIV: Non-reactive (06/07 0000)  GBS:   negative  Assessment/Plan: Labor Postdates Covid -19 positive asymptomatic P) admit routine labs. Pitocin augmentation. Epidural   Helen Shah A Helen Shah 08/20/2021, 3:19 PM

## 2021-08-20 NOTE — Discharge Instructions (Signed)
Labor precautions

## 2021-08-20 NOTE — MAU Note (Signed)
..  Helen Shah is a 30 y.o. at [redacted]w[redacted]d here in MAU reporting: Contractions every minute. Denies vaginal bleeding or leaking of fluid.  GBS neg

## 2021-08-21 ENCOUNTER — Encounter (HOSPITAL_COMMUNITY): Payer: Self-pay | Admitting: Obstetrics and Gynecology

## 2021-08-21 LAB — CBC
HCT: 28 % — ABNORMAL LOW (ref 36.0–46.0)
Hemoglobin: 9.5 g/dL — ABNORMAL LOW (ref 12.0–15.0)
MCH: 32.6 pg (ref 26.0–34.0)
MCHC: 33.9 g/dL (ref 30.0–36.0)
MCV: 96.2 fL (ref 80.0–100.0)
Platelets: 183 10*3/uL (ref 150–400)
RBC: 2.91 MIL/uL — ABNORMAL LOW (ref 3.87–5.11)
RDW: 13.5 % (ref 11.5–15.5)
WBC: 17.4 10*3/uL — ABNORMAL HIGH (ref 4.0–10.5)
nRBC: 0 % (ref 0.0–0.2)

## 2021-08-21 LAB — RPR: RPR Ser Ql: NONREACTIVE

## 2021-08-21 NOTE — Lactation Note (Signed)
This note was copied from a baby's chart. Lactation Consultation Note  Patient Name: Helen Shah [id number] Date: 08/21/2021 Reason for consult: Initial assessment Age:29 hours  P2, Ex BF 18 mos. Baby latched upon entering with flanged lips and intermittent swallows. Mother states L&D MD stated baby had short frenulum and mother asked questions how to identify.  Suggest discussing with Ped MD and provided resource information  sheet.  LC did not evaluate due to breastfeeding session. Mother denies discomfort.  Basics reviewed.  Mom made aware of O/P services, breastfeeding support groups, community resources, and our phone # for post-discharge questions.    Maternal Data Has patient been taught Hand Expression?: Yes Does the patient have breastfeeding experience prior to this delivery?: Yes How long did the patient breastfeed?: 18 mos.  Feeding Mother's Current Feeding Choice: Breast Milk  LATCH Score Latch: Grasps breast easily, tongue down, lips flanged, rhythmical sucking.  Audible Swallowing: A few with stimulation  Type of Nipple: Everted at rest and after stimulation  Comfort (Breast/Nipple): Soft / non-tender  Hold (Positioning): No assistance needed to correctly position infant at breast.  LATCH Score: 9    Interventions Interventions: Breast feeding basics reviewed;Education   Consult Status Consult Status: Follow-up Date: 08/22/21 Follow-up type: In-patient    Dahlia Byes [facility] 08/21/2021, 10:05 AM

## 2021-08-21 NOTE — Progress Notes (Signed)
SVD: primary  S:  Pt reports feeling well/ Tolerating po/ Voiding without problems/ No n/v/ Bleeding is light/ Pain controlled withprescription NSAID's including ibuprofen (Motrin) and narcotic analgesics including oxycodone/acetaminophen (Percocet, Tylox)    O:  A & O x 3 / VS: Blood pressure 114/68, pulse 89, temperature 98.4 F (36.9 C), temperature source Oral, resp. rate 20, height 5\' 2"  (1.575 m), weight 76.2 kg, SpO2 100 %, unknown if currently breastfeeding.  LABS:  Results for orders placed or performed during the hospital encounter of 08/20/21 (from the past 24 hour(s))  CBC     Status: Abnormal   Collection Time: 08/21/21  4:13 AM  Result Value Ref Range   WBC 17.4 (H) 4.0 - 10.5 K/uL   RBC 2.91 (L) 3.87 - 5.11 MIL/uL   Hemoglobin 9.5 (L) 12.0 - 15.0 g/dL   HCT 08/23/21 (L) 75.1 - 02.5 %   MCV 96.2 80.0 - 100.0 fL   MCH 32.6 26.0 - 34.0 pg   MCHC 33.9 30.0 - 36.0 g/dL   RDW 85.2 77.8 - 24.2 %   Platelets 183 150 - 400 K/uL   nRBC 0.0 0.0 - 0.2 %    I&O: I/O last 3 completed shifts: In: 2630 [P.O.:480; I.V.:2000; IV Piggyback:150] Out: 2262 [Urine:1250; Blood:1012]   Total I/O In: 2493.1 [I.V.:2458.8; Other:34.3] Out: 2950 [Urine:2950]  Lungs: chest clear, no wheezing, rales, normal symmetric air entry  Heart: regular rate and rhythm, S1, S2 normal, no murmur, click, rub or gallop  Abdomen: soft uterus firm at umb Primary dressing d/c /i  Perineum: not inspected  Lochia: light  Extremities:no redness or tenderness in the calves or thighs, edema 2+    A/P: POD # 1/PPD # 1/ G2P2002 s/p primary C-section Covid 19 positive asx   Doing well  Continue routine post partum orders  Possible early discharge tomorrow

## 2021-08-22 ENCOUNTER — Inpatient Hospital Stay (HOSPITAL_COMMUNITY): Payer: 59

## 2021-08-22 MED ORDER — IBUPROFEN 600 MG PO TABS
600.0000 mg | ORAL_TABLET | Freq: Four times a day (QID) | ORAL | 11 refills | Status: DC | PRN
Start: 2021-08-22 — End: 2023-09-28

## 2021-08-22 MED ORDER — OXYCODONE HCL 5 MG PO TABS: 5.0000 mg | ORAL_TABLET | ORAL | 0 refills | Status: AC | PRN

## 2021-08-22 NOTE — Discharge Summary (Addendum)
Postpartum Discharge Summary  Date of Service updated     Patient Name: Helen Shah DOB: [birth year] MRN: [id number]  Date of admission: 08/20/2021 Delivery date:08/20/2021  Delivering provider: Sedalia Greeson  Date of discharge: 08/22/2021  Admitting diagnosis: Indication for care in labor or delivery [O75.9] Postpartum care following cesarean delivery [Z39.2] Intrauterine pregnancy: [redacted]w[redacted]d    Secondary diagnosis:  Principal Problem:   Indication for care in labor or delivery Active Problems:   Postpartum care following cesarean delivery  Additional problems: right lymphedema, Covid 19 positive    Discharge diagnosis: Term Pregnancy Delivered and Anemia    , Covid 19 positive                                          Post partum procedures: n/a Augmentation: Pitocin Complications: None  Hospital course: Onset of Labor With Unplanned C/S   30y.o. yo G2P1002 at 43w3das [place] [place]. Patient had a labor course significant for pitocin augmentation. The patient went for cesarean section due to Non-Reassuring FHR. Delivery details as follows: Membrane Rupture Time/Date: 3:05 PM ,08/20/2021   Delivery Method:C-Section, Low Transverse  Details of operation can be found in separate operative note. Patient had an uncomplicated postpartum course.  She is ambulating,tolerating a regular diet, passing flatus, and urinating well.  Patient is discharged home in stable condition 08/22/2021  Newborn Data: Birth date:08/20/2021  Birth time:4:51 PM  Gender:Female  Living status:Living  Apgars:8 ,9  Weight:3.49 kg   Magnesium Sulfate received: No BMZ received: No Rhophylac:No MMR:No T-DaP:Given prenatally Flu: No Transfusion:No  Physical exam  Vitals:   08/21/21 0444 08/21/21 0838 08/21/21 2020 08/22/21 0405  BP:  114/68 (!) 115/57 118/65  Pulse:  89 88 63  Resp: '20 20 20 18  ' Temp: 99.2 F (37.3 C) 98.4 F (36.9 C) 98 F (36.7 C) 98.2 F  (36.8 C)  TempSrc: Tympanic Oral Oral Oral  SpO2: 100% 100% 100% 100%  Weight:      Height:       General: alert, cooperative, and no distress Lochia: appropriate Uterine Fundus: firm Incision: Dressing is clean, dry, and intact DVT Evaluation: No evidence of DVT seen on physical exam. Calf/Ankle edema is present Labs: Lab Results  Component Value Date   WBC 17.4 (H) 08/21/2021   HGB 9.5 (L) 08/21/2021   HCT 28.0 (L) 08/21/2021   MCV 96.2 08/21/2021   PLT 183 08/21/2021   No flowsheet data found. Edinburgh Score: Edinburgh Postnatal Depression Scale Screening Tool 08/22/2021  I have been able to laugh and see the funny side of things. 0  I have looked forward with enjoyment to things. 0  I have blamed myself unnecessarily when things went wrong. 0  I have been anxious or worried for no good reason. 0  I have felt scared or panicky for no good reason. 0  Things have been getting on top of me. 0  I have been so unhappy that I have had difficulty sleeping. 0  I have felt sad or miserable. 0  I have been so unhappy that I have been crying. 0  The thought of harming myself has occurred to me. 0  Edinburgh Postnatal Depression Scale Total 0      After visit meds:  Allergies as of 08/22/2021   No Known Allergies      Medication  List     TAKE these medications    ibuprofen 600 MG tablet Commonly known as: ADVIL Take 1 tablet (600 mg total) by mouth every 6 (six) hours as needed.   multivitamin-prenatal 27-0.8 MG Tabs tablet Take 1 tablet by mouth daily at 12 noon.   oxyCODONE 5 MG immediate release tablet Commonly known as: Oxy IR/ROXICODONE Take 1-2 tablets (5-10 mg total) by mouth every 4 (four) hours as needed for up to 7 days for moderate pain.         Discharge home in stable condition Infant Feeding: Breast Infant Disposition:home with mother Discharge instruction: per After Visit Summary and Postpartum booklet. Activity: Advance as tolerated.  Pelvic rest for 6 weeks.  Diet: routine diet Anticipated Birth Control: Condoms Postpartum Appointment:6 weeks Additional Postpartum F/U:  n/a Future Appointments:No future appointments. Follow up Visit:  Follow-up Information     Servando Salina, MD Follow up in 6 week(s).   Specialty: Obstetrics and Gynecology Contact information: [address] [facility] [place] [address] [phone]                     08/22/2021 Marvene Staff, MD

## 2021-08-22 NOTE — Progress Notes (Signed)
SVD: primary  S:  Pt reports feeling well. No symptoms from  Covid 19 positive/ Tolerating po/ Voiding without problems/ No n/v/ Bleeding is light/ Pain controlled withprescription NSAID's including ibuprofen (Motrin) and narcotic analgesics including oxycodone/acetaminophen (Percocet, Tylox)    O:  A & O x 3 / VS: Blood pressure 118/65, pulse 63, temperature 98.2 F (36.8 C), temperature source Oral, resp. rate 18, height 5\' 2"  (1.575 m), weight 76.2 kg, SpO2 100 %, unknown if currently breastfeeding.  LABS: No results found for this or any previous visit (from the past 24 hour(s)).  I&O: I/O last 3 completed shifts: In: 2973.1 [P.O.:480; I.V.:2458.8; Other:34.3] Out: 3650 [Urine:3650]   No intake/output data recorded.  Lungs: chest clear, no wheezing, rales, normal symmetric air entry  Heart: regular rate and rhythm, S1, S2 normal, no murmur, click, rub or gallop  Abdomen: soft uterus firm at umb Primary dressing d/c/i  Perineum: not inspected  Lochia: light  Extremities:no redness or tenderness in the calves or thighs, edema R>>L+    A/P: POD # 2/PPD # 2/ s/p cesarean section Covid 19 positive   Doing well  Continue routine post partum orders  D/c instructions reviewed  F/u 6 wk

## 2021-08-23 NOTE — Op Note (Signed)
Helen Shah, PAU MEDICAL RECORD NO: [id number] ACCOUNT NO: [id number] DATE OF BIRTH: Oct 14, 1990 FACILITY: [facility] LOCATION: [id number] PHYSICIAN: Sargent Mankey A. Ellington Greenslade, MD  Operative Report   PREOPERATIVE DIAGNOSES:  Nonreassuring fetal tracing, intrauterine gestation at 31 and 3/7 weeks, COVID-19 positive infection.  PROCEDURE:  Primary cesarean section, Kerr hysterotomy.  POSTOPERATIVE DIAGNOSES:  Nonreassuring fetal tracing, intrauterine gestation at 90 and 3/7 weeks, COVID-19 positive infection.  ANESTHESIA:  Epidural.  SURGEON:  Asaad Gulley A. Cherly Hensen, MD  ASSISTANT:  None.  DESCRIPTION OF PROCEDURE:  Under adequate epidural anesthesia, the patient was placed in the supine position with left lateral tilt.  She was sterilely prepped and draped in the usual fashion.  Indwelling Foley catheter was already in place.  0.25%  Marcaine was injected along the planned Pfannenstiel skin incision site.  Pfannenstiel skin incision was then made, carried down to the rectus fascia.  The rectus fascia was opened transversely.  The rectus fascia was then bluntly and sharply dissected  off the rectus muscle in superior and inferior fashion.  The rectus muscle was split in the midline.  The parietal peritoneum was entered sharply and extended.  A self-retaining Alexis retractor was then placed.  Vesicouterine peritoneum was opened  transversely.  The bladder was displaced inferiorly with blunt dissection.  A small curvilinear low transverse uterine incision was then made and extended bluntly in cephalic and caudad manner.  Subsequent delivery of a live female from the left occiput  transverse position with a nuchal cord and a loop next to the chest along with her right fist. The baby was delivered, bulb suctioned on the abdomen.  Delayed cord clamp x1 minute.  Cord was subsequently clamped and cut, and the baby was transferred to  the awaiting pediatricians who assigned Apgars of 8 and 9 at one and five  minutes.  Placenta was spontaneous intact, not sent to pathology.  Uterine cavity was cleaned of debris.  Uterine incision had no extension.  It was closed in two layers, first  layer was 0 Monocryl running locked stitch, second layer was imbricated using 0 Monocryl suture.  Bleeding on the right angle resulted in 2 figure-of-eight sutures being placed.  Normal tubes and ovaries were noted bilaterally.  The abdomen was irrigated  and suctioned of debris.  Interceed was placed overlying the lower uterine segment incision in inverted T fashion.  The Alexis retractor was removed.  The parietal peritoneum was closed with 2-0 Vicryl.  The rectus fascia was closed with 0 Vicryl x2.   The subcutaneous tissue was irrigated, small bleeders cauterized, interrupted 2-0 plain sutures were placed, and the skin was approximated using 4-0 Vicryl subcuticular closure.  Benzoin and Steri-Strips were placed with honeycomb dressing.  SPECIMEN:  Placenta, not sent to pathology.  INTRAOPERATIVE FLUID: 2200 mL.  URINE OUTPUT:  200 mL.  ESTIMATED BLOOD LOSS: 900 mL.  COMPLICATIONS:  None.  The patient tolerated the procedure well and was transferred to recovery room in stable condition.   ROH D: 08/20/2021 5:45:37 pm T: 08/20/2021 6:30:00 pm  JOB: [id number]/ [id number]

## 2021-09-01 ENCOUNTER — Telehealth (HOSPITAL_COMMUNITY): Payer: Self-pay

## 2021-09-01 NOTE — Telephone Encounter (Signed)
No answer. Left message to return nurse call.  Marcelino Duster [facility] 09/01/2021,1618

## 2022-05-18 IMAGING — US US OB TRANSVAGINAL
1 series · 15 of 28 positions shown · non-contrast
Comparison: 12/23/2020

CLINICAL DATA: First trimester pregnancy, assess viability

EXAM:
TRANSVAGINAL OB ULTRASOUND
TECHNIQUE: Transvaginal ultrasound was performed for complete evaluation of the
gestation as well as the maternal uterus, adnexal regions, and
pelvic cul-de-sac.

[Series 1: us ob transvaginal · 58 acquisitions, 15 frames shown]
[im 1/58]
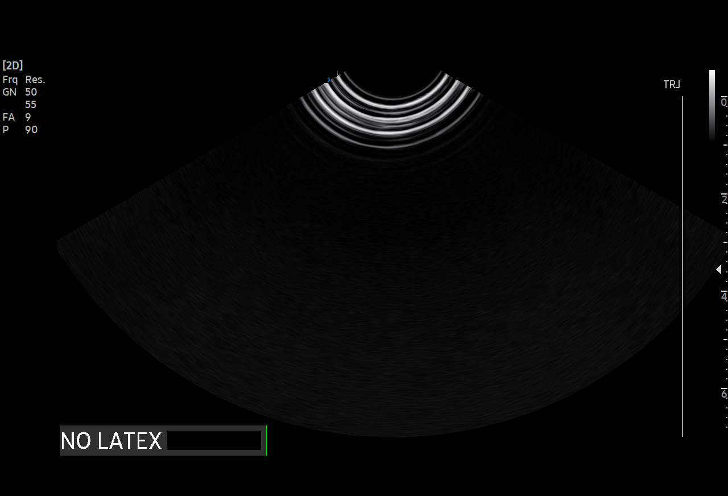
[im 5/58]
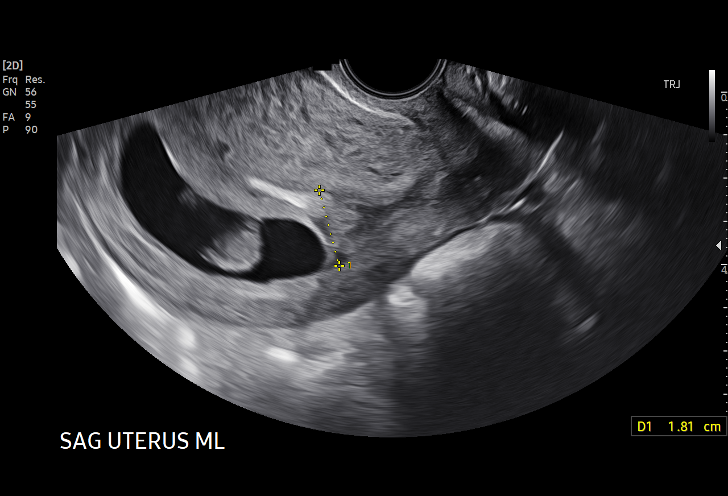
[im 9/58]
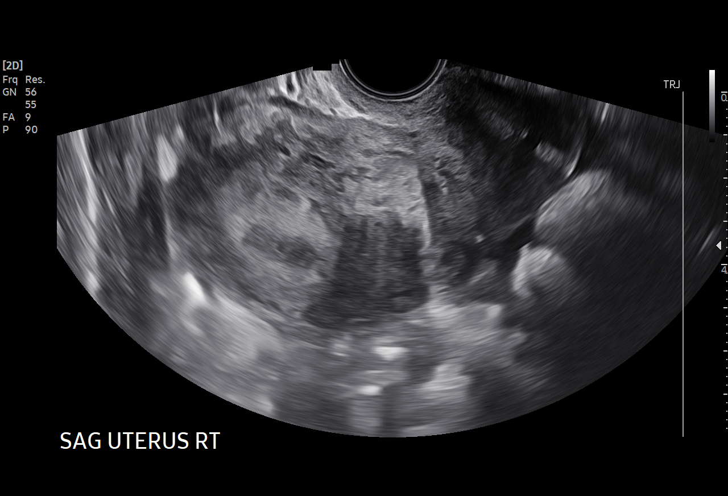
[im 13/58]
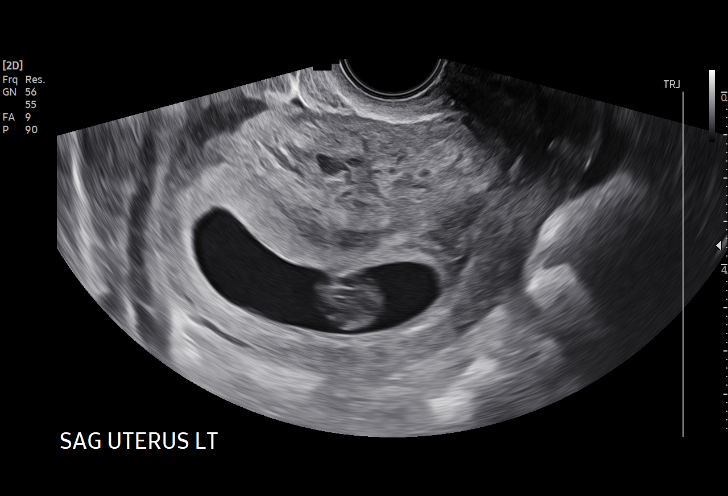
[im 17/58]
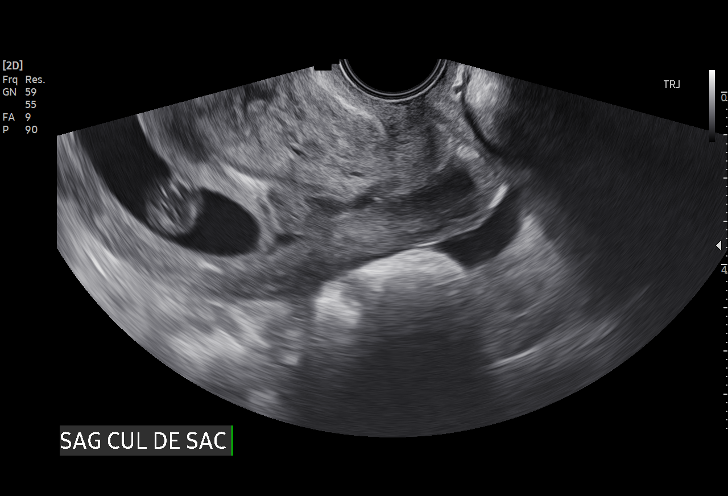
[im 22/58]
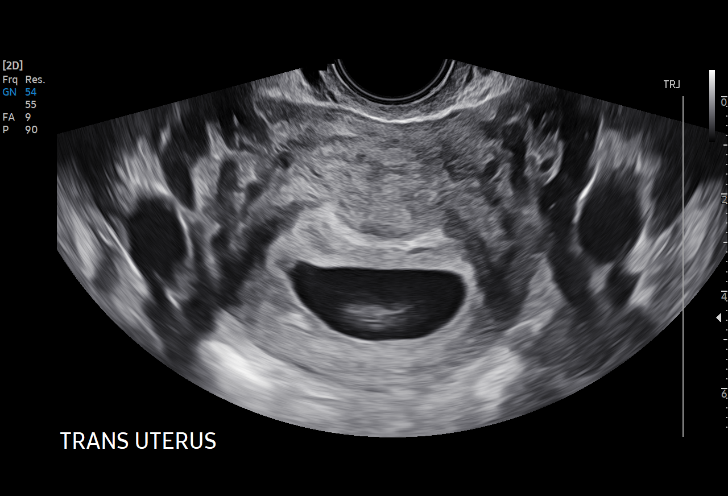
[im 26/58]
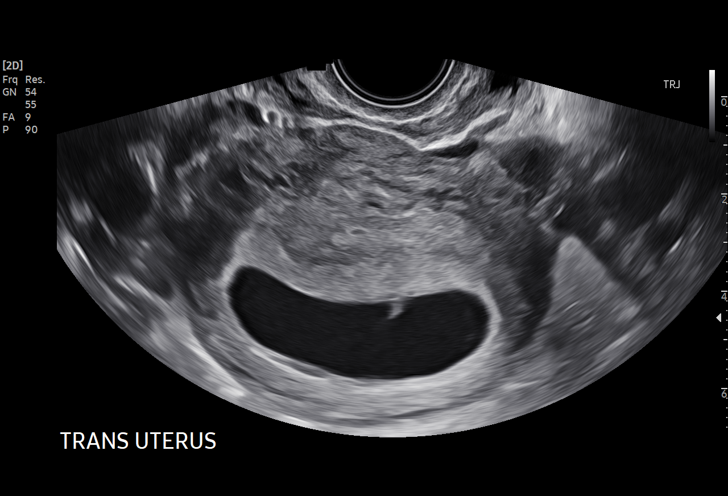
[im 30/58]
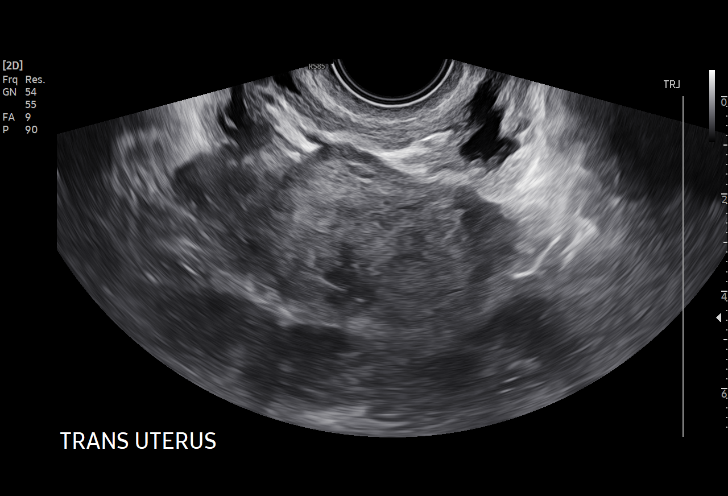
[im 32/58]
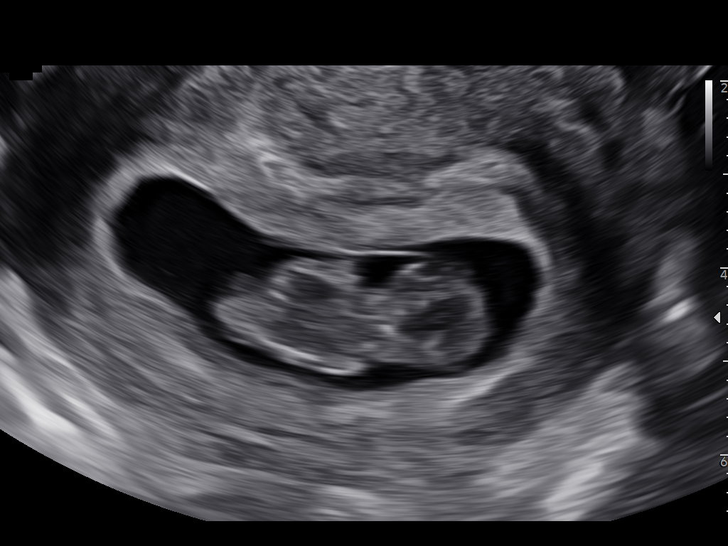
[im 36/58]
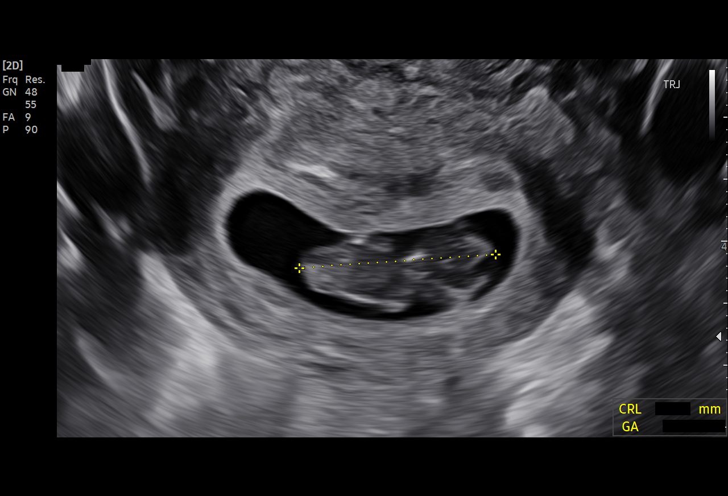
[im 41/58]
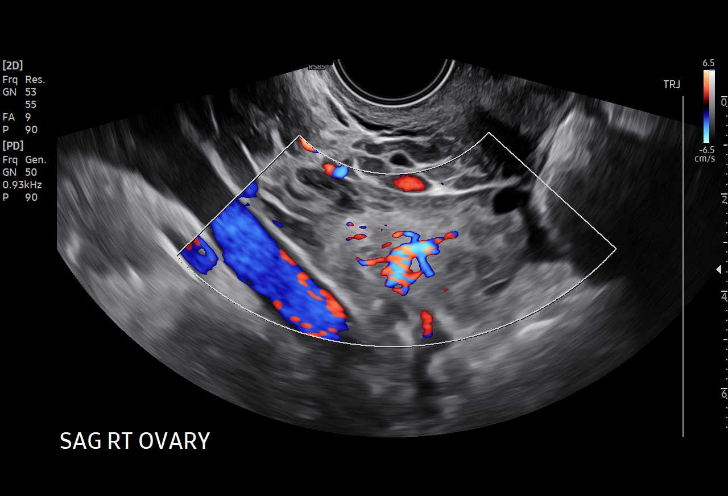
[im 45/58]
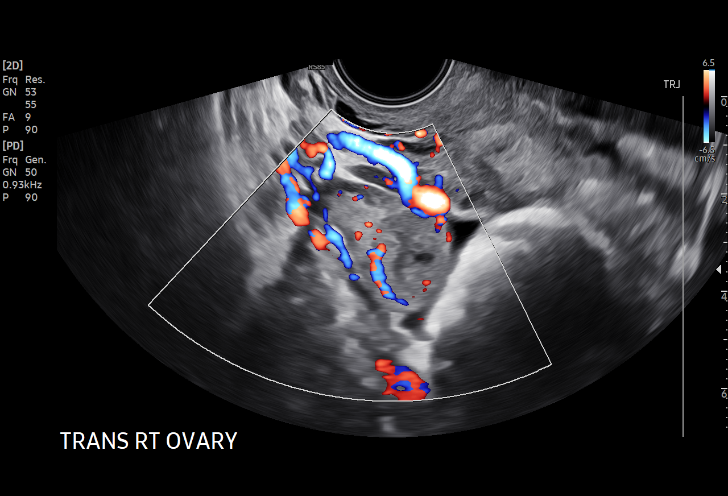
[im 49/58]
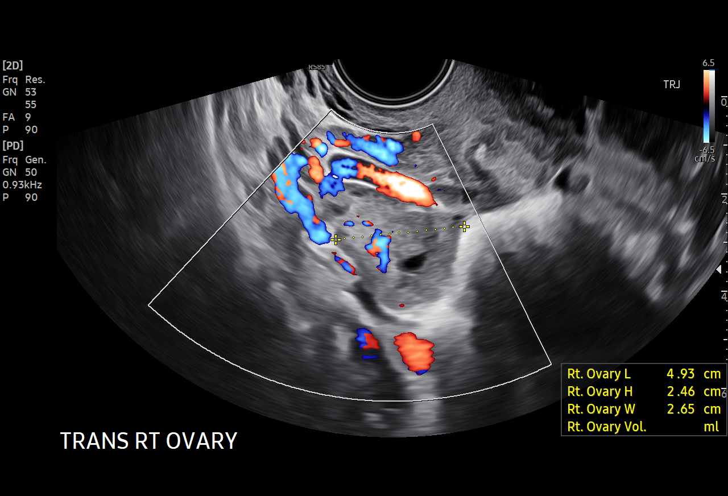
[im 53/58]
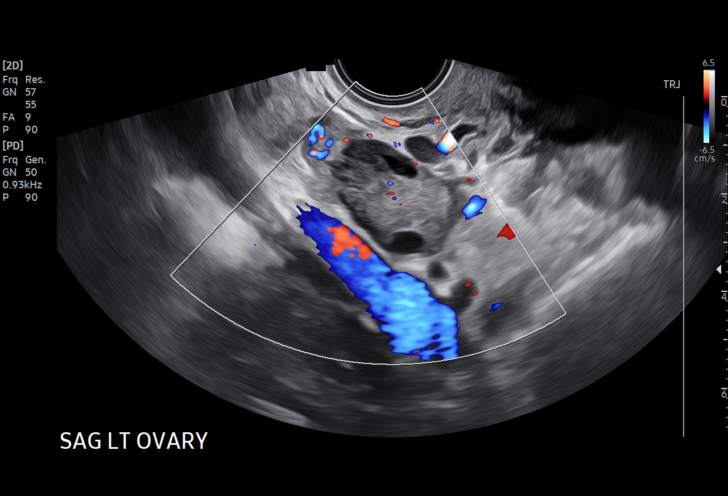
[im 58/58]
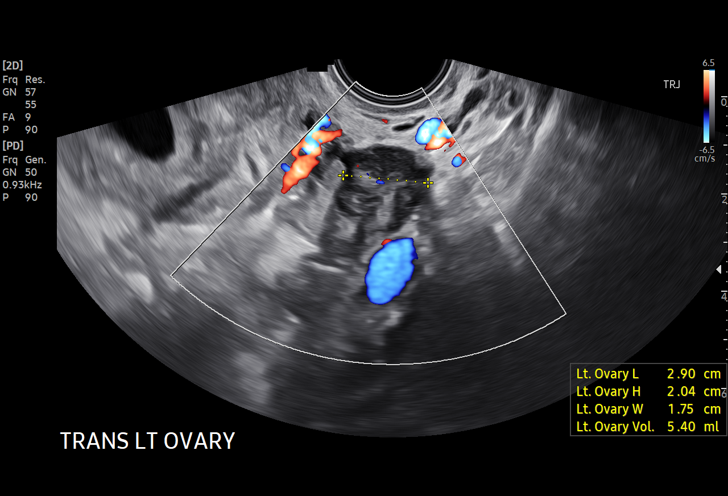

[15 of 28 positions shown; findings below may reference images not displayed]

FINDINGS: Intrauterine gestational sac: Present, single

Yolk sac:  Present

Embryo:  Present

Cardiac Activity: Present

Heart Rate: 177 bpm

CRL:   31.5 mm   10 w 0 d                  US EDC: 08/17/2021

Subchorionic hemorrhage:  Minimal subchronic hemorrhage

Maternal uterus/adnexae:

RIGHT ovary measures 4.9 x 2.5 x 2.7 cm and contains a small corpus
luteum.

LEFT ovary normal size and morphology 2.9 x 2.0 x 1.8 cm.

Maternal uterus anteverted, otherwise unremarkable.

Trace free pelvic fluid.

No adnexal masses.
IMPRESSION: Single live intrauterine gestation at at 10 weeks 0 days EGA by
crown-rump length.

Minimal subchronic hemorrhage.

## 2023-09-28 ENCOUNTER — Encounter (HOSPITAL_COMMUNITY): Payer: Self-pay | Admitting: Obstetrics and Gynecology

## 2023-09-28 ENCOUNTER — Inpatient Hospital Stay (HOSPITAL_COMMUNITY)
Admission: AD | Admit: 2023-09-28 | Discharge: 2023-09-28 | Disposition: A | Discharge status: Home / Self Care | Payer: 59 | Attending: Obstetrics and Gynecology | Admitting: Obstetrics and Gynecology

## 2023-09-28 ENCOUNTER — Inpatient Hospital Stay (HOSPITAL_COMMUNITY): Payer: 59

## 2023-09-28 DIAGNOSIS — O26891 Other specified pregnancy related conditions, first trimester: Secondary | ICD-10-CM | POA: Diagnosis not present

## 2023-09-28 DIAGNOSIS — R509 Fever, unspecified: Secondary | ICD-10-CM | POA: Diagnosis present

## 2023-09-28 DIAGNOSIS — Z3A08 8 weeks gestation of pregnancy: Secondary | ICD-10-CM | POA: Diagnosis not present

## 2023-09-28 DIAGNOSIS — J101 Influenza due to other identified influenza virus with other respiratory manifestations: Secondary | ICD-10-CM

## 2023-09-28 DIAGNOSIS — Z20828 Contact with and (suspected) exposure to other viral communicable diseases: Secondary | ICD-10-CM | POA: Diagnosis not present

## 2023-09-28 DIAGNOSIS — Z331 Pregnant state, incidental: Secondary | ICD-10-CM

## 2023-09-28 LAB — RESPIRATORY PANEL BY PCR

## 2023-09-28 LAB — CBC WITH DIFFERENTIAL/PLATELET
Abs Immature Granulocytes: 0.02 10*3/uL (ref 0.00–0.07)
Basophils Absolute: 0 10*3/uL (ref 0.0–0.1)
Basophils Relative: 0 %
Eosinophils Absolute: 0 10*3/uL (ref 0.0–0.5)
Eosinophils Relative: 0 %
HCT: 35.4 % — ABNORMAL LOW (ref 36.0–46.0)
Hemoglobin: 12.2 g/dL (ref 12.0–15.0)
Immature Granulocytes: 0 %
Lymphocytes Relative: 26 %
Lymphs Abs: 1.8 10*3/uL (ref 0.7–4.0)
MCH: 31.6 pg (ref 26.0–34.0)
MCHC: 34.5 g/dL (ref 30.0–36.0)
MCV: 91.7 fL (ref 80.0–100.0)
Monocytes Absolute: 0.9 10*3/uL (ref 0.1–1.0)
Monocytes Relative: 14 %
Neutro Abs: 4 10*3/uL (ref 1.7–7.7)
Neutrophils Relative %: 60 %
Platelets: 226 10*3/uL (ref 150–400)
RBC: 3.86 MIL/uL — ABNORMAL LOW (ref 3.87–5.11)
RDW: 12 % (ref 11.5–15.5)
WBC: 6.8 10*3/uL (ref 4.0–10.5)
nRBC: 0 % (ref 0.0–0.2)

## 2023-09-28 LAB — POCT PREGNANCY, URINE: Preg Test, Ur: POSITIVE — AB

## 2023-09-28 MED ORDER — ACETAMINOPHEN 500 MG PO TABS
1000.0000 mg | ORAL_TABLET | Freq: Once | ORAL | Status: AC
  Administered 2023-09-28: 1000 mg via ORAL
  Filled 2023-09-28: qty 2

## 2023-09-28 NOTE — Progress Notes (Signed)
Dr. Cherly Hensen spoke to pt via Vocera, lab/tests results reviewed and treatment plan (rest, push fluids, Tylenol) discussed.  Pt verbalized understanding and agreeable

## 2023-09-28 NOTE — MAU Note (Signed)
Helen Shah is a 33 y.o. at [redacted]w[redacted]d here in MAU reporting: husband(2wksago), son(last wk) and daughter(been sick since Monday have/had the flu. Hasn't felt well since husband was sick. Tuesday it eally hit her, chills, congestion, headache, cough, sore throat, ear ache and fever.. (Primary had her due at home flu tests- was going to prescribe Tamiflu if +; pt had 2 neg home tests) Called Dr Cherly Hensen, has been taking tylenol.  States last night before taking Tylenol her temp was 105.0, after Tylenol it was 103.0.  feels like she is getting worse not better.  Wanting to make sure the baby is ok, and that she doesn't have an ear infection or need antibiotics or something else. LMP: 12/10 Onset of complaint: ~2 wks, worse since Tues- fever Thur night Pain score: aches 6; HA 5, ear 3, throat 2 Denies vag bleeding, has had some abd cramping- but hurts all over.  There were no vitals filed for this visit.    Lab orders placed from triage:     Last Tylenol 1000mg  at 0730

## 2023-09-28 NOTE — MAU Provider Note (Incomplete)
History     Chief Complaint  Patient presents with   Generalized Body Aches   Headache   Fever     {GYN/OB ZO:[id number]}  Past Medical History:  Diagnosis Date   Lymphedema     Past Surgical History:  Procedure Laterality Date   CESAREAN SECTION N/A 08/20/2021   Procedure: CESAREAN SECTION;  Surgeon: Maxie Better, MD;  Location: [facility] LD [facility]S;  Service: Obstetrics;  Laterality: N/A;   NO PAST SURGERIES      Family History  Problem Relation Age of Onset   Cancer Mother        Breast   Healthy Father     Social History   Tobacco Use   Smoking status: Never   Smokeless tobacco: Never  Vaping Use   Vaping status: Never Used  Substance Use Topics   Alcohol use: Never   Drug use: Never    Allergies: No Known Allergies  Medications Prior to Admission  Medication Sig Dispense Refill Last Dose/Taking   acetaminophen (TYLENOL) 500 MG tablet Take 1,000 mg by mouth every 6 (six) hours as needed.   09/28/2023 at  7:30 AM   Prenatal Vit-Fe Fumarate-FA (MULTIVITAMIN-PRENATAL) 27-0.8 MG TABS tablet Take 1 tablet by mouth daily at 12 noon.   09/27/2023 at  9:30 PM   ibuprofen (ADVIL) 600 MG tablet Take 1 tablet (600 mg total) by mouth every 6 (six) hours as needed. 30 tablet 11      Physical Exam   Blood pressure 105/62, pulse (!) 101, temperature 98.2 F (36.8 C), temperature source Oral, resp. rate 17, height 5\' 2"  (1.575 m), weight 56.7 kg, SpO2 100%, unknown if currently breastfeeding.  {exam, Complete:18323} ED Course   MDM ***  Serita Kyle, MD 2:13 PM 09/28/2023

## 2023-09-28 NOTE — MAU Provider Note (Signed)
None     S Ms. Helen Shah is a 33 y.o. G3P1002 patient who presents to MAU today with complaint of exposure to flu at home. She reports elevated temperature, body aches, sore throat, rhinorrhea, .   O BP 105/62 (BP Location: Right Arm)   Pulse (!) 101   Temp 98.2 F (36.8 C) (Oral)   Resp 17   Ht 5\' 2"  (1.575 m)   Wt 56.7 kg   SpO2 100%   BMI 22.88 kg/m  Physical Exam Vitals reviewed.  Constitutional:      General: She is not in acute distress.    Appearance: Normal appearance. She is ill-appearing. She is not toxic-appearing or diaphoretic.  HENT:     Head: Normocephalic.  Cardiovascular:     Rate and Rhythm: Normal rate.     Pulses: Normal pulses.     Heart sounds: Normal heart sounds.  Pulmonary:     Effort: Pulmonary effort is normal.  Skin:    General: Skin is warm and dry.     Capillary Refill: Capillary refill takes less than 2 seconds.  Neurological:     Mental Status: She is alert and oriented to person, place, and time.  Psychiatric:        Mood and Affect: Mood normal.        Behavior: Behavior normal.        Thought Content: Thought content normal.        Judgment: Judgment normal.     A Medical screening exam complete   P Patient of Dr. Cherly Hensen, CNM to bedside for MSE only.  Dr. Cherly Hensen notified of patient presence by nursing staff.   Warning signs for worsening condition that would warrant emergency follow-up discussed Patient may return to MAU as needed   Richardson Landry, CNM 09/28/2023 1:00 PM

## 2023-10-22 LAB — OB RESULTS CONSOLE GC/CHLAMYDIA
Chlamydia: NEGATIVE
Neisseria Gonorrhea: NEGATIVE

## 2023-10-22 LAB — OB RESULTS CONSOLE RUBELLA ANTIBODY, IGM: Rubella: IMMUNE

## 2023-10-22 LAB — HEPATITIS C ANTIBODY: HCV Ab: NEGATIVE

## 2023-10-22 LAB — OB RESULTS CONSOLE HIV ANTIBODY (ROUTINE TESTING): HIV: NONREACTIVE

## 2023-10-23 LAB — OB RESULTS CONSOLE HEPATITIS B SURFACE ANTIGEN: Hepatitis B Surface Ag: NEGATIVE

## 2023-12-01 ENCOUNTER — Telehealth: Payer: Self-pay | Admitting: Allergy & Immunology

## 2023-12-01 NOTE — Telephone Encounter (Signed)
 Entered in error. I was trying to get into her daughter's chart and clicked on this by accident.

## 2024-02-26 LAB — OB RESULTS CONSOLE RPR: RPR: NONREACTIVE

## 2024-04-07 LAB — OB RESULTS CONSOLE GBS: GBS: NEGATIVE

## 2024-04-29 ENCOUNTER — Encounter (HOSPITAL_COMMUNITY): Payer: Self-pay

## 2024-04-29 ENCOUNTER — Telehealth (HOSPITAL_COMMUNITY): Payer: Self-pay | Admitting: *Deleted

## 2024-04-29 NOTE — Patient Instructions (Signed)
 Helen Shah  04/29/2024   Your procedure is scheduled on:  05/04/2024  Arrive at 0700 at [employer] [facility] on [address] at [facility]  and [employer]. You are invited to use the FREE valet parking or use the Visitor's parking deck.  Pick up the phone at the desk and dial [phone].  Call this number if you have problems the morning of surgery: [phone]  Remember:   Do not eat food:(After Midnight) Desps de medianoche.  You may drink clear liquids until  ___0500__.  Clear liquids means a liquid you can see thru.  It can have color such as Cola or Kool aid.  Tea is OK and coffee as long as no milk or creamer of any kind.  Take these medicines the morning of surgery with A SIP OF WATER:  none   Do not wear jewelry, make-up or nail polish.  Do not wear lotions, powders, or perfumes. Do not wear deodorant.  Do not shave 48 hours prior to surgery.  Do not bring valuables to the hospital.  [facility] is not   responsible for any belongings or valuables brought to the hospital.  Contacts, dentures or bridgework may not be worn into surgery.  Leave suitcase in the car. After surgery it may be brought to your room.  For patients admitted to the hospital, checkout time is 11:00 AM the day of              discharge.      Please read over the following fact sheets that you were given:     Preparing for Surgery

## 2024-04-29 NOTE — Telephone Encounter (Signed)
 Preadmission screen

## 2024-04-30 ENCOUNTER — Encounter (HOSPITAL_COMMUNITY): Payer: Self-pay

## 2024-05-02 ENCOUNTER — Encounter (HOSPITAL_COMMUNITY)
Admission: RE | Admit: 2024-05-02 | Discharge: 2024-05-02 | Disposition: A | Discharge status: Home / Self Care | Source: Ambulatory Visit | Attending: Obstetrics and Gynecology | Admitting: Obstetrics and Gynecology

## 2024-05-02 DIAGNOSIS — Z01812 Encounter for preprocedural laboratory examination: Secondary | ICD-10-CM | POA: Insufficient documentation

## 2024-05-02 DIAGNOSIS — Z01818 Encounter for other preprocedural examination: Secondary | ICD-10-CM

## 2024-05-02 LAB — TYPE AND SCREEN
ABO/RH(D): B POS
Antibody Screen: NEGATIVE

## 2024-05-02 LAB — CBC
HCT: 38.6 % (ref 36.0–46.0)
Hemoglobin: 12.7 g/dL (ref 12.0–15.0)
MCH: 31.4 pg (ref 26.0–34.0)
MCHC: 32.9 g/dL (ref 30.0–36.0)
MCV: 95.5 fL (ref 80.0–100.0)
Platelets: 234 K/uL (ref 150–400)
RBC: 4.04 MIL/uL (ref 3.87–5.11)
RDW: 14.6 % (ref 11.5–15.5)
WBC: 10.4 K/uL (ref 4.0–10.5)
nRBC: 0 % (ref 0.0–0.2)

## 2024-05-02 LAB — RPR: RPR Ser Ql: NONREACTIVE

## 2024-05-04 ENCOUNTER — Other Ambulatory Visit: Payer: Self-pay

## 2024-05-04 ENCOUNTER — Encounter (HOSPITAL_COMMUNITY): Payer: Self-pay | Admitting: Obstetrics and Gynecology

## 2024-05-04 ENCOUNTER — Inpatient Hospital Stay (HOSPITAL_COMMUNITY): Admitting: Anesthesiology

## 2024-05-04 ENCOUNTER — Inpatient Hospital Stay (HOSPITAL_COMMUNITY)
Admission: AD | Admit: 2024-05-04 | Discharge: 2024-05-06 | DRG: 786 | Disposition: A | Discharge status: Home / Self Care | Attending: Obstetrics and Gynecology | Admitting: Obstetrics and Gynecology

## 2024-05-04 ENCOUNTER — Encounter (HOSPITAL_COMMUNITY)
Admission: AD | Disposition: A | Discharge status: Home / Self Care | Payer: Self-pay | Source: Home / Self Care | Attending: Obstetrics and Gynecology

## 2024-05-04 DIAGNOSIS — Z3A39 39 weeks gestation of pregnancy: Secondary | ICD-10-CM

## 2024-05-04 DIAGNOSIS — O9942 Diseases of the circulatory system complicating childbirth: Secondary | ICD-10-CM | POA: Diagnosis present

## 2024-05-04 DIAGNOSIS — I890 Lymphedema, not elsewhere classified: Secondary | ICD-10-CM | POA: Diagnosis present

## 2024-05-04 DIAGNOSIS — O43193 Other malformation of placenta, third trimester: Secondary | ICD-10-CM | POA: Diagnosis present

## 2024-05-04 DIAGNOSIS — O99214 Obesity complicating childbirth: Secondary | ICD-10-CM | POA: Diagnosis present

## 2024-05-04 DIAGNOSIS — O34219 Maternal care for unspecified type scar from previous cesarean delivery: Secondary | ICD-10-CM

## 2024-05-04 DIAGNOSIS — O9962 Diseases of the digestive system complicating childbirth: Secondary | ICD-10-CM | POA: Diagnosis present

## 2024-05-04 DIAGNOSIS — O34211 Maternal care for low transverse scar from previous cesarean delivery: Principal | ICD-10-CM | POA: Diagnosis present

## 2024-05-04 DIAGNOSIS — K219 Gastro-esophageal reflux disease without esophagitis: Secondary | ICD-10-CM | POA: Diagnosis present

## 2024-05-04 DIAGNOSIS — Z01818 Encounter for other preprocedural examination: Principal | ICD-10-CM

## 2024-05-04 SURGERY — Surgical Case
Anesthesia: Spinal | Site: Abdomen

## 2024-05-04 MED ORDER — CEFAZOLIN SODIUM-DEXTROSE 2-4 GM/100ML-% IV SOLN
INTRAVENOUS | Status: AC
  Filled 2024-05-04: qty 100

## 2024-05-04 MED ORDER — FENTANYL CITRATE (PF) 100 MCG/2ML IJ SOLN
INTRAMUSCULAR | Status: DC | PRN
  Administered 2024-05-04: 15 ug via INTRATHECAL

## 2024-05-04 MED ORDER — SOD CITRATE-CITRIC ACID 500-334 MG/5ML PO SOLN
30.0000 mL | ORAL | Status: AC
  Administered 2024-05-04: 30 mL via ORAL

## 2024-05-04 MED ORDER — SOD CITRATE-CITRIC ACID 500-334 MG/5ML PO SOLN
ORAL | Status: AC
Start: 2024-05-04 — End: 2024-05-04
  Filled 2024-05-04: qty 30

## 2024-05-04 MED ORDER — DEXAMETHASONE SODIUM PHOSPHATE 10 MG/ML IJ SOLN
INTRAMUSCULAR | Status: AC
  Filled 2024-05-04: qty 1

## 2024-05-04 MED ORDER — ZOLPIDEM TARTRATE 5 MG PO TABS: 5.0000 mg | ORAL_TABLET | Freq: Every evening | ORAL | Status: DC | PRN

## 2024-05-04 MED ORDER — MEPERIDINE HCL 25 MG/ML IJ SOLN: 6.2500 mg | INTRAMUSCULAR | Status: DC | PRN

## 2024-05-04 MED ORDER — OXYCODONE HCL 5 MG/5ML PO SOLN: 5.0000 mg | Freq: Once | ORAL | Status: DC | PRN

## 2024-05-04 MED ORDER — ACETAMINOPHEN 500 MG PO TABS: 1000.0000 mg | ORAL_TABLET | Freq: Four times a day (QID) | ORAL | Status: DC

## 2024-05-04 MED ORDER — SODIUM CHLORIDE 0.9 % IV SOLN: 12.5000 mg | INTRAVENOUS | Status: DC | PRN

## 2024-05-04 MED ORDER — MENTHOL 3 MG MT LOZG: 1.0000 | LOZENGE | OROMUCOSAL | Status: DC | PRN

## 2024-05-04 MED ORDER — BUPIVACAINE HCL (PF) 0.25 % IJ SOLN
INTRAMUSCULAR | Status: AC
Start: 2024-05-04 — End: 2024-05-04
  Filled 2024-05-04: qty 10

## 2024-05-04 MED ORDER — DIPHENHYDRAMINE HCL 50 MG/ML IJ SOLN
12.5000 mg | INTRAMUSCULAR | Status: DC | PRN
Start: 2024-05-04 — End: 2024-05-04

## 2024-05-04 MED ORDER — BUPIVACAINE HCL (PF) 0.25 % IJ SOLN
INTRAMUSCULAR | Status: DC | PRN
  Administered 2024-05-04: 10 mL

## 2024-05-04 MED ORDER — LACTATED RINGERS IV SOLN: INTRAVENOUS | Status: DC

## 2024-05-04 MED ORDER — DIBUCAINE (PERIANAL) 1 % EX OINT
1.0000 | TOPICAL_OINTMENT | CUTANEOUS | Status: DC | PRN
Start: 2024-05-04 — End: 2024-05-07

## 2024-05-04 MED ORDER — TETANUS-DIPHTH-ACELL PERTUSSIS 5-2.5-18.5 LF-MCG/0.5 IM SUSY: 0.5000 mL | PREFILLED_SYRINGE | Freq: Once | INTRAMUSCULAR | Status: DC

## 2024-05-04 MED ORDER — FENTANYL CITRATE (PF) 100 MCG/2ML IJ SOLN
INTRAMUSCULAR | Status: AC
  Filled 2024-05-04: qty 2

## 2024-05-04 MED ORDER — SIMETHICONE 80 MG PO CHEW: 80.0000 mg | CHEWABLE_TABLET | ORAL | Status: DC | PRN

## 2024-05-04 MED ORDER — TRANEXAMIC ACID-NACL 1000-0.7 MG/100ML-% IV SOLN
INTRAVENOUS | Status: AC
  Filled 2024-05-04: qty 100

## 2024-05-04 MED ORDER — ACETAMINOPHEN 500 MG PO TABS
1000.0000 mg | ORAL_TABLET | Freq: Four times a day (QID) | ORAL | Status: DC
  Administered 2024-05-04 – 2024-05-06 (×9): 1000 mg via ORAL
  Filled 2024-05-04 (×9): qty 2

## 2024-05-04 MED ORDER — OXYTOCIN-SODIUM CHLORIDE 30-0.9 UT/500ML-% IV SOLN
INTRAVENOUS | Status: AC
Start: 2024-05-04 — End: 2024-05-04
  Filled 2024-05-04: qty 500

## 2024-05-04 MED ORDER — ONDANSETRON HCL 4 MG/2ML IJ SOLN
INTRAMUSCULAR | Status: AC
  Filled 2024-05-04: qty 2

## 2024-05-04 MED ORDER — COCONUT OIL OIL: 1.0000 | TOPICAL_OIL | Status: DC | PRN

## 2024-05-04 MED ORDER — HYDROMORPHONE HCL 1 MG/ML IJ SOLN: 0.2500 mg | INTRAMUSCULAR | Status: DC | PRN

## 2024-05-04 MED ORDER — DIPHENHYDRAMINE HCL 25 MG PO CAPS: 25.0000 mg | ORAL_CAPSULE | Freq: Four times a day (QID) | ORAL | Status: DC | PRN

## 2024-05-04 MED ORDER — PHENYLEPHRINE HCL-NACL 20-0.9 MG/250ML-% IV SOLN
INTRAVENOUS | Status: DC | PRN
  Administered 2024-05-04: 60 ug/min via INTRAVENOUS

## 2024-05-04 MED ORDER — ONDANSETRON HCL 4 MG/2ML IJ SOLN
4.0000 mg | Freq: Three times a day (TID) | INTRAMUSCULAR | Status: DC | PRN
  Filled 2024-05-04: qty 2

## 2024-05-04 MED ORDER — SODIUM CHLORIDE 0.9% FLUSH: 3.0000 mL | INTRAVENOUS | Status: DC | PRN

## 2024-05-04 MED ORDER — IBUPROFEN 600 MG PO TABS
600.0000 mg | ORAL_TABLET | Freq: Four times a day (QID) | ORAL | Status: DC
  Administered 2024-05-04 – 2024-05-06 (×10): 600 mg via ORAL
  Filled 2024-05-04 (×10): qty 1

## 2024-05-04 MED ORDER — DIPHENHYDRAMINE HCL 25 MG PO CAPS: 25.0000 mg | ORAL_CAPSULE | ORAL | Status: DC | PRN

## 2024-05-04 MED ORDER — ACETAMINOPHEN 10 MG/ML IV SOLN
INTRAVENOUS | Status: DC | PRN
Start: 2024-05-04 — End: 2024-05-04
  Administered 2024-05-04: 1000 mg via INTRAVENOUS

## 2024-05-04 MED ORDER — SODIUM CHLORIDE 0.9 % IR SOLN
Status: DC | PRN
  Administered 2024-05-04: 100 mL

## 2024-05-04 MED ORDER — SENNOSIDES-DOCUSATE SODIUM 8.6-50 MG PO TABS
2.0000 | ORAL_TABLET | ORAL | Status: DC
  Administered 2024-05-05 – 2024-05-06 (×2): 2 via ORAL
  Filled 2024-05-04 (×2): qty 2

## 2024-05-04 MED ORDER — SIMETHICONE 80 MG PO CHEW
80.0000 mg | CHEWABLE_TABLET | Freq: Three times a day (TID) | ORAL | Status: DC
  Administered 2024-05-04 – 2024-05-06 (×7): 80 mg via ORAL
  Filled 2024-05-04 (×7): qty 1

## 2024-05-04 MED ORDER — POVIDONE-IODINE 10 % EX SWAB
2.0000 | Freq: Once | CUTANEOUS | Status: AC
  Administered 2024-05-04: 2 via TOPICAL

## 2024-05-04 MED ORDER — NALOXONE HCL 0.4 MG/ML IJ SOLN: 0.4000 mg | INTRAMUSCULAR | Status: DC | PRN

## 2024-05-04 MED ORDER — BUPIVACAINE IN DEXTROSE 0.75-8.25 % IT SOLN
INTRATHECAL | Status: DC | PRN
  Administered 2024-05-04: 1.6 mL via INTRATHECAL

## 2024-05-04 MED ORDER — OXYTOCIN-SODIUM CHLORIDE 30-0.9 UT/500ML-% IV SOLN: 2.5000 [IU]/h | INTRAVENOUS | Status: AC

## 2024-05-04 MED ORDER — MORPHINE SULFATE (PF) 0.5 MG/ML IJ SOLN
INTRAMUSCULAR | Status: AC
Start: 2024-05-04 — End: 2024-05-04
  Filled 2024-05-04: qty 10

## 2024-05-04 MED ORDER — NALOXONE HCL 4 MG/10ML IJ SOLN: 1.0000 ug/kg/h | INTRAVENOUS | Status: DC | PRN

## 2024-05-04 MED ORDER — MORPHINE SULFATE (PF) 0.5 MG/ML IJ SOLN
INTRAMUSCULAR | Status: DC | PRN
  Administered 2024-05-04: 150 ug via INTRATHECAL

## 2024-05-04 MED ORDER — TRANEXAMIC ACID-NACL 1000-0.7 MG/100ML-% IV SOLN
INTRAVENOUS | Status: DC | PRN
  Administered 2024-05-04: 1000 mg via INTRAVENOUS

## 2024-05-04 MED ORDER — PRENATAL MULTIVITAMIN CH
1.0000 | ORAL_TABLET | Freq: Every day | ORAL | Status: DC
  Administered 2024-05-05 – 2024-05-06 (×2): 1 via ORAL
  Filled 2024-05-04 (×2): qty 1

## 2024-05-04 MED ORDER — KETOROLAC TROMETHAMINE 30 MG/ML IJ SOLN: 30.0000 mg | Freq: Once | INTRAMUSCULAR | Status: DC | PRN

## 2024-05-04 MED ORDER — OXYCODONE HCL 5 MG PO TABS: 5.0000 mg | ORAL_TABLET | Freq: Once | ORAL | Status: DC | PRN

## 2024-05-04 MED ORDER — OXYTOCIN-SODIUM CHLORIDE 30-0.9 UT/500ML-% IV SOLN
INTRAVENOUS | Status: DC | PRN
  Administered 2024-05-04: 300 mL via INTRAVENOUS

## 2024-05-04 MED ORDER — PHENYLEPHRINE HCL-NACL 20-0.9 MG/250ML-% IV SOLN
INTRAVENOUS | Status: AC
  Filled 2024-05-04: qty 250

## 2024-05-04 MED ORDER — DEXAMETHASONE SODIUM PHOSPHATE 10 MG/ML IJ SOLN
INTRAMUSCULAR | Status: DC | PRN
  Administered 2024-05-04: 10 mg via INTRAVENOUS

## 2024-05-04 MED ORDER — WITCH HAZEL-GLYCERIN EX PADS: 1.0000 | MEDICATED_PAD | CUTANEOUS | Status: DC | PRN

## 2024-05-04 MED ORDER — DEXMEDETOMIDINE HCL IN NACL 80 MCG/20ML IV SOLN
INTRAVENOUS | Status: DC | PRN
  Administered 2024-05-04: 8 ug via INTRAVENOUS

## 2024-05-04 MED ORDER — CEFAZOLIN SODIUM-DEXTROSE 2-4 GM/100ML-% IV SOLN
2.0000 g | INTRAVENOUS | Status: AC
  Administered 2024-05-04: 2 g via INTRAVENOUS

## 2024-05-04 MED ORDER — ONDANSETRON HCL 4 MG/2ML IJ SOLN
INTRAMUSCULAR | Status: DC | PRN
  Administered 2024-05-04: 4 mg via INTRAVENOUS

## 2024-05-04 MED ORDER — OXYCODONE HCL 5 MG PO TABS
5.0000 mg | ORAL_TABLET | ORAL | Status: DC | PRN
  Administered 2024-05-04 (×2): 10 mg via ORAL
  Administered 2024-05-05 (×4): 5 mg via ORAL
  Administered 2024-05-05: 10 mg via ORAL
  Administered 2024-05-06 (×3): 5 mg via ORAL
  Filled 2024-05-04 (×2): qty 1
  Filled 2024-05-04: qty 2
  Filled 2024-05-04 (×2): qty 1
  Filled 2024-05-04: qty 2
  Filled 2024-05-04: qty 1
  Filled 2024-05-04: qty 2
  Filled 2024-05-04 (×2): qty 1

## 2024-05-04 SURGICAL SUPPLY — 37 items
BARRIER ADHS 3X4 INTERCEED (GAUZE/BANDAGES/DRESSINGS) ×2 IMPLANT
BENZOIN TINCTURE PRP APPL 2/3 (GAUZE/BANDAGES/DRESSINGS) ×1 IMPLANT
CLAMP UMBILICAL CORD (MISCELLANEOUS) IMPLANT
CLOTH BEACON ORANGE TIMEOUT ST (SAFETY) ×2 IMPLANT
DRAPE C SECTION CLR SCREEN (DRAPES) ×2 IMPLANT
DRSG OPSITE POSTOP 4X10 (GAUZE/BANDAGES/DRESSINGS) ×2 IMPLANT
DRSG TELFA 3X8 NADH STRL (GAUZE/BANDAGES/DRESSINGS) ×1 IMPLANT
ELECTRODE REM PT RTRN 9FT ADLT (ELECTROSURGICAL) ×2 IMPLANT
EXTRACTOR VACUUM M CUP 4 TUBE (SUCTIONS) IMPLANT
GLOVE BIOGEL PI IND STRL 7.0 (GLOVE) ×4 IMPLANT
GLOVE ECLIPSE 6.5 STRL STRAW (GLOVE) ×2 IMPLANT
GOWN STRL REUS W/TWL LRG LVL3 (GOWN DISPOSABLE) ×4 IMPLANT
KIT ABG SYR 3ML LUER SLIP (SYRINGE) IMPLANT
MAT PREVALON FULL STRYKER (MISCELLANEOUS) ×1 IMPLANT
NDL HYPO 25X5/8 SAFETYGLIDE (NEEDLE) IMPLANT
NEEDLE HYPO 22GX1.5 SAFETY (NEEDLE) ×2 IMPLANT
NEEDLE HYPO 25X5/8 SAFETYGLIDE (NEEDLE) IMPLANT
NS IRRIG 1000ML POUR BTL (IV SOLUTION) ×2 IMPLANT
PACK C SECTION WH (CUSTOM PROCEDURE TRAY) ×2 IMPLANT
PAD OB MATERNITY 4.3X12.25 (PERSONAL CARE ITEMS) ×2 IMPLANT
RTRCTR C-SECT PINK 25CM LRG (MISCELLANEOUS) IMPLANT
STRIP CLOSURE SKIN 1/2X4 (GAUZE/BANDAGES/DRESSINGS) ×1 IMPLANT
SUT CHROMIC GUT AB #0 18 (SUTURE) IMPLANT
SUT MNCRL 0 VIOLET CTX 36 (SUTURE) ×6 IMPLANT
SUT MON AB 2-0 SH27 (SUTURE) IMPLANT
SUT MON AB 3-0 SH27 (SUTURE) IMPLANT
SUT MON AB 4-0 PS1 27 (SUTURE) IMPLANT
SUT PLAIN 2 0 XLH (SUTURE) IMPLANT
SUT PLAIN ABS 2-0 CT1 27XMFL (SUTURE) IMPLANT
SUT VIC AB 0 CT1 36 (SUTURE) ×4 IMPLANT
SUT VIC AB 2-0 CT1 TAPERPNT 27 (SUTURE) ×2 IMPLANT
SUT VIC AB 4-0 PS2 27 (SUTURE) IMPLANT
SUTURE PLAIN GUT 2.0 ETHICON (SUTURE) ×1 IMPLANT
SYR CONTROL 10ML LL (SYRINGE) ×2 IMPLANT
TOWEL OR 17X24 6PK STRL BLUE (TOWEL DISPOSABLE) ×2 IMPLANT
TRAY FOLEY W/BAG SLVR 14FR LF (SET/KITS/TRAYS/PACK) IMPLANT
WATER STERILE IRR 1000ML POUR (IV SOLUTION) ×2 IMPLANT

## 2024-05-04 NOTE — Interval H&P Note (Signed)
 History and Physical Interval Note:  05/04/2024 8:09 AM  Helen Shah  has presented today for surgery, with the diagnosis of previous cesarean section, desires sterilization.  The various methods of treatment have been discussed with the patient and family. After consideration of risks, benefits and other options for treatment, the patient has consented to  PROCEDURE: REPEAT CESAREAN SECTION as a surgical intervention.  The patient's history has been reviewed, patient examined, no change in status, stable for surgery.  I have reviewed the patient's chart and labs.  Questions were answered to the patient's satisfaction.     Lavoy Bernards A Reverie Vaquera

## 2024-05-04 NOTE — Anesthesia Postprocedure Evaluation (Signed)
 Anesthesia Post Note  Patient: Merl Guardino  Procedure(s) Performed: CESAREAN SECTION (Abdomen)     Patient location during evaluation: PACU Anesthesia Type: Spinal Level of consciousness: awake and alert Pain management: pain level controlled Vital Signs Assessment: post-procedure vital signs reviewed and stable Respiratory status: spontaneous breathing, nonlabored ventilation and respiratory function stable Cardiovascular status: blood pressure returned to baseline and stable Postop Assessment: no apparent nausea or vomiting Anesthetic complications: no   No notable events documented.  Last Vitals:  Vitals:   05/04/24 1145 05/04/24 1208  BP: 113/73 119/60  Pulse: 88 92  Resp: 15 16  Temp:  36.8 C  SpO2: 98% 98%    Last Pain:  Vitals:   05/04/24 1212  TempSrc:   PainSc: 0-No pain   Pain Goal:    LLE Motor Response: Purposeful movement (05/04/24 1200) LLE Sensation: Tingling (05/04/24 1200) RLE Motor Response: Purposeful movement (05/04/24 1200) RLE Sensation: Tingling (05/04/24 1200)     Epidural/Spinal Function Cutaneous sensation: Tingles (05/04/24 1212), Patient able to flex knees: Yes (05/04/24 1212), Patient able to lift hips off bed: No (05/04/24 1212), Back pain beyond tenderness at insertion site: No (05/04/24 1212), Progressively worsening motor and/or sensory loss: No (05/04/24 1212), Bowel and/or bladder incontinence post epidural: No (05/04/24 1212)  Butler Levander Pinal

## 2024-05-04 NOTE — Brief Op Note (Signed)
 05/04/2024  10:59 AM  PATIENT:  Helen Shah  33 y.o. female  PRE-OPERATIVE DIAGNOSIS:  previous cesarean section, early labor, term gestation  POST-OPERATIVE DIAGNOSIS:  previous cesarean section, early labor term gestation  PROCEDURE:  repeat Cesarean section, kerr hysterotomy SURGEON:  Surgeons and Role:    * Rutherford Gain, MD - Primary  PHYSICIAN ASSISTANT:   ASSISTANTS: none   ANESTHESIA:   spinal Findings: live female LOT floating mec fluid with particulate material, nl tubes and ovaries, loop of cord next to chest EBL:  441  BLOOD ADMINISTERED:none  DRAINS: none   LOCAL MEDICATIONS USED:  MARCAINE      SPECIMEN:  No Specimen  DISPOSITION OF SPECIMEN:  N/A  COUNTS:  YES  TOURNIQUET:  * No tourniquets in log *  DICTATION: .Other Dictation: Dictation Number [id number]  PLAN OF CARE: Admit to inpatient   PATIENT DISPOSITION:  PACU - hemodynamically stable.   Delay start of Pharmacological VTE agent (>24hrs) due to surgical blood loss or risk of bleeding: no

## 2024-05-04 NOTE — Transfer of Care (Signed)
 Immediate Anesthesia Transfer of Care Note  Patient: Helen Shah  Procedure(s) Performed: CESAREAN SECTION, WITH BILATERAL TUBAL LIGATION  Patient Location: PACU  Anesthesia Type:Spinal  Level of Consciousness: awake, alert , and oriented  Airway & Oxygen Therapy: Patient Spontanous Breathing  Post-op Assessment: Report given to RN and Post -op Vital signs reviewed and stable  Post vital signs: Reviewed and stable  Last Vitals:  Vitals Value Taken Time  BP 116/70 05/04/24 11:00  Temp 36.2 C 05/04/24 10:57  Pulse 86 05/04/24 11:00  Resp 19 05/04/24 11:00  SpO2 98 % 05/04/24 11:00  Vitals shown include unfiled device data.  Last Pain:  Vitals:   05/04/24 1057  TempSrc: Axillary  PainSc:          Complications: No notable events documented.

## 2024-05-04 NOTE — Anesthesia Procedure Notes (Signed)
 Spinal  Patient location during procedure: OB Start time: 05/04/2024 9:17 AM End time: 05/04/2024 9:22 AM Reason for block: surgical anesthesia Staffing Performed: anesthesiologist  Anesthesiologist: Cleotilde Butler Dade, MD Performed by: Cleotilde Butler Dade, MD Authorized by: Cleotilde Butler Dade, MD   Preanesthetic Checklist Completed: patient identified, IV checked, risks and benefits discussed, surgical consent, monitors and equipment checked, pre-op evaluation and timeout performed Spinal Block Patient position: sitting Prep: DuraPrep and site prepped and draped Patient monitoring: heart rate, cardiac monitor, continuous pulse ox and blood pressure Approach: midline Location: L3-4 Injection technique: single-shot Needle Needle type: Pencan  Needle gauge: 24 G Needle length: 10 cm Assessment Sensory level: T4 Events: CSF return

## 2024-05-04 NOTE — Anesthesia Preprocedure Evaluation (Addendum)
 Anesthesia Evaluation  Patient identified by MRN, date of birth, ID band Patient awake    Reviewed: Allergy & Precautions, NPO status , Patient's Chart, lab work & pertinent test results  Airway Mallampati: II  TM Distance: >3 FB Neck ROM: Full    Dental no notable dental hx.    Pulmonary  Covid 19 +   Pulmonary exam normal        Cardiovascular + Peripheral Vascular Disease  Normal cardiovascular exam  Lymphedema   Neuro/Psych negative neurological ROS  negative psych ROS   GI/Hepatic Neg liver ROS,GERD  ,,  Endo/Other  Obesity  Renal/GU negative Renal ROS  negative genitourinary   Musculoskeletal negative musculoskeletal ROS (+)    Abdominal  (+) + obese  Peds  Hematology   Anesthesia Other Findings   Reproductive/Obstetrics (+) Pregnancy                              Anesthesia Physical Anesthesia Plan  ASA: 2  Anesthesia Plan: Spinal   Post-op Pain Management:    Induction:   PONV Risk Score and Plan: 2  Airway Management Planned: Natural Airway  Additional Equipment:   Intra-op Plan:   Post-operative Plan:   Informed Consent: I have reviewed the patients History and Physical, chart, labs and discussed the procedure including the risks, benefits and alternatives for the proposed anesthesia with the patient or authorized representative who has indicated his/her understanding and acceptance.       Plan Discussed with: Anesthesiologist and CRNA  Anesthesia Plan Comments:          Anesthesia Quick Evaluation

## 2024-05-04 NOTE — Op Note (Unsigned)
 [name], Helen Shah MEDICAL RECORD NO: [id number] ACCOUNT NO: [id number] DATE OF BIRTH: 26-Jun-1991 FACILITY: [facility] LOCATION: [facility] PHYSICIAN: Sony Schlarb A. Rutherford, MD  Operative Report   DATE OF PROCEDURE: 05/04/2024  PREOPERATIVE DIAGNOSES: 1.  Previous cesarean section. 2.  Early labor. 3.  Term gestation.  PROCEDURE: 1.  Repeat cesarean section. 2.  Faythe hysterotomy.  POSTOPERATIVE DIAGNOSES: 1.  Previous cesarean section. 2.  Early labor. 3.  Term gestation.  ANESTHESIA:  Spinal.  SURGEON:  Dickie Rutherford, MD  ASSISTANT:  None.  DESCRIPTION OF PROCEDURE:  Under adequate spinal anesthesia the patient was placed in the supine position with a left lateral tilt.  She was sterilely prepped and draped in the usual fashion.  An indwelling Foley catheter was sterilely placed.  0.25%  Marcaine  was injected along the previous Pfannenstiel skin incision.  A Pfannenstiel skin incision was then made and carried down to the rectus fascia.  The rectus fascia was opened transversely.  The rectus fascia was then bluntly and sharply dissected  off the rectus muscle in a superior and inferior fashion.  The rectus muscle was split in the midline.  The parietoperitoneum was entered sharply and extended.  A self-retaining Alexis retractor was then placed.  The vesicouterine peritoneum was opened  transversely and the bladder was sharply dissected off the lower uterine segment and displaced inferiorly.  A small curvilinear lower uterine segment incision was made and extended bluntly in a cephalic and caudad manner.  It was evident that there was  light meconium with particulate material noted.  While with the intact bag, the baby whose floating vertex was stabilized.  The artificial rupture then occurred and subsequent delivery of a live female from the left occiput transverse position with a loop  of cord next to the chest.  The baby was bulb suctioned on the abdomen.  Delayed cord clamp life x1  minute was done and the baby was transferred to the awaiting pediatrician who assigned Apgars of 8 at 1 minute and 9 at 5 minutes.  The placenta was  manually removed.  The uterine cavity was cleaned of debris using dry sponge.  The uterine incision had no extension.  It was closed in two layers.  The first layer using 0 Monocryl running locked.  The second layer was imbricated using 0 Monocryl  suture.  Normal tubes and ovaries were noted bilaterally.  The abdomen was irrigated and suctioned of debris.  The Alexis retractor was then removed.  The parietoperitoneum was then closed with 2-0 Vicryl.  The rectus fascia was closed with 0 Vicryl x2.   The subcutaneous area was irrigated.  Small bleeders were cauterized.  Interrupted 2-0 plain suture was placed and the skin was approximated using a subcuticular 4-0 Monocryl suture.  Steri-Strips and Benzoin were placed and a pressure dressing was  placed as the patient was slightly oozy throughout the case.  SPECIMEN:  Placenta not sent to pathology.  ESTIMATED BLOOD LOSS:  441 mL.  INTRAOPERATIVE FLUIDS:  1500 mL.  URINE OUTPUT:  400 mL of clear yellow urine.  COUNTS:  Sponge and instrument counts x2 were correct.  COMPLICATIONS:  None.  DISPOSITION:  The patient tolerated the procedure well and was transferred to the recovery room in stable condition.   PUS D: 05/04/2024 10:59:20 am T: 05/04/2024 12:38:00 pm  JOB: [id number]/ [id number]

## 2024-05-04 NOTE — H&P (Addendum)
 Helen Shah is a 33 y.o. female presenting for  repeat C/S, TL at [redacted] wk gestation due to previous c/s, desires sterilization. OB History     Gravida  3   Para  2   Term  2   Preterm      AB      Living  2      SAB      IAB      Ectopic      Multiple  0   Live Births  2          Past Medical History:  Diagnosis Date   Lymphedema    Past Surgical History:  Procedure Laterality Date   CESAREAN SECTION N/A 08/20/2021   Procedure: CESAREAN SECTION;  Surgeon: Rutherford Gain, MD;  Location: [facility] LD ORS;  Service: Obstetrics;  Laterality: N/A;   NO PAST SURGERIES     Family History: family history includes Cancer in her mother; Healthy in her father. Social History:  reports that she has never smoked. She has never used smokeless tobacco. She reports that she does not drink alcohol and does not use drugs.     Maternal Diabetes: No Genetic Screening: Normal Maternal Ultrasounds/Referrals: Normal Fetal Ultrasounds or other Referrals:  None Maternal Substance Abuse:  No Significant Maternal Medications:  None Significant Maternal Lab Results:  Group B Strep negative Number of Prenatal Visits:greater than 3 verified prenatal visits Maternal Vaccinations:none Other Comments:  marginal cord insertion  Review of Systems  All other systems reviewed and are negative.  History   Height 5' 2 (1.575 m), weight 84.1 kg, unknown if currently breastfeeding. Exam Physical Exam Constitutional:      Appearance: Normal appearance.  HENT:     Head: Atraumatic.  Eyes:     Extraocular Movements: Extraocular movements intact.  Cardiovascular:     Rate and Rhythm: Regular rhythm.     Heart sounds: Normal heart sounds.  Pulmonary:     Breath sounds: Normal breath sounds.  Abdominal:     Palpations: Abdomen is soft.     Comments: Gravid Pfannenstiel scar  Musculoskeletal:        General: Swelling present.  Skin:    General: Skin is warm and dry.  Neurological:      General: No focal deficit present.     Mental Status: She is alert and oriented to person, place, and time.  Psychiatric:        Mood and Affect: Mood normal.        Behavior: Behavior normal.     Prenatal labs: ABO, Rh: --/--/B POS (09/05 1148) Antibody: NEG (09/05 1148) Rubella:  Immune RPR: NON REACTIVE (09/05 1200)  HBsAg:   neg HIV:   neg GBS:   neg  Assessment/Plan: Previous Cesarean section Desires sterilization Term gestation P) rpt C/s, TL. Procedure explained. Risk of surgery reviewed including infection, bleeding, injury to bladder, bowel, ureter, internal scar tissue, permanent sterilization, failure rate 1/300, non reversible, possible need for blood transfusion and its risk(HIV, hepatitis, acute rxn). All ? answered   Mazikeen Hehn A Uday Jantz 05/04/2024, 7:23 AM  Addendum: pt here for surgery Ctx q   VE 3/50/-3 Declines sterilization Will proceed with repeat C/S only

## 2024-05-05 ENCOUNTER — Encounter (HOSPITAL_COMMUNITY): Payer: Self-pay | Admitting: Obstetrics and Gynecology

## 2024-05-05 LAB — CBC
HCT: 32.5 % — ABNORMAL LOW (ref 36.0–46.0)
Hemoglobin: 10.8 g/dL — ABNORMAL LOW (ref 12.0–15.0)
MCH: 31.6 pg (ref 26.0–34.0)
MCHC: 33.2 g/dL (ref 30.0–36.0)
MCV: 95 fL (ref 80.0–100.0)
Platelets: 210 K/uL (ref 150–400)
RBC: 3.42 MIL/uL — ABNORMAL LOW (ref 3.87–5.11)
RDW: 14.7 % (ref 11.5–15.5)
WBC: 16 K/uL — ABNORMAL HIGH (ref 4.0–10.5)
nRBC: 0 % (ref 0.0–0.2)

## 2024-05-05 NOTE — Lactation Note (Signed)
 This note was copied from a baby's chart. Lactation Consultation Note  Patient Name: Helen Shah Unijb'd Date: 05/05/2024 Age:32 hours Reason for consult: Initial assessment;Term;Infant weight loss;Breastfeeding assistance (5 % weight loss) As LC entered the room mom mentioned the baby has been cluster feeding. LC updated the doc flow sheets and are WNL:for age.  LC changed the diaper for mom and LC worked on positioning in the cross cradle and depth, swallows noted and baby only fed 5 mins , but had a good latch.    Maternal Data Has patient been taught Hand Expression?:  (mom is experienced , LC recommended prior to latch to prime  milk ducts) Does the patient have breastfeeding experience prior to this delivery?: Yes How long did the patient breastfeed?: per mom 1st and 2nd baby 20 months .  Feeding Mother's Current Feeding Choice: Breast Milk  LATCH Score Latch: Grasps breast easily, tongue down, lips flanged, rhythmical sucking. (worked on depth and positioning)  Audible Swallowing: A few with stimulation  Type of Nipple: Everted at rest and after stimulation  Comfort (Breast/Nipple): Soft / non-tender  Hold (Positioning): Assistance needed to correctly position infant at breast and maintain latch.  LATCH Score: 8   Lactation Tools Discussed/Used  None as of now   Interventions Interventions: Breast feeding basics reviewed;Assisted with latch;Skin to skin;Breast massage;Hand express;Adjust position;Support pillows;Position options;Education;LC Services brochure;CDC milk storage guidelines;CDC Guidelines for Breast Pump Cleaning  Discharge Pump: DEBP;Manual;Personal;Hands Free  Consult Status Consult Status: Follow-up Date: 05/06/24 Follow-up type: In-patient    Rollene Caldron Latoya Diskin 05/05/2024, 11:51 AM

## 2024-05-05 NOTE — Progress Notes (Signed)
 SVD: {findings; c - section delivery reason:31449}  S:  Pt reports feeling ***/ Tolerating po/ Voiding without problems/ No n/v/ Bleeding is {Description; bleeding vaginal:11356}/ Pain controlled with{treatments; pain control med:13496}    O:  A & O x 3 ***/ VS: Blood pressure 124/72, pulse 91, temperature 97.7 F (36.5 C), temperature source Oral, resp. rate 18, height 5' 2 (1.575 m), weight 84.1 kg, SpO2 100%, unknown if currently breastfeeding.  LABS:  Results for orders placed or performed during the hospital encounter of 05/04/24 (from the past 24 hours)  CBC     Status: Abnormal   Collection Time: 05/05/24  4:37 AM  Result Value Ref Range   WBC 16.0 (H) 4.0 - 10.5 K/uL   RBC 3.42 (L) 3.87 - 5.11 MIL/uL   Hemoglobin 10.8 (L) 12.0 - 15.0 g/dL   HCT 67.4 (L) 63.9 - 53.9 %   MCV 95.0 80.0 - 100.0 fL   MCH 31.6 26.0 - 34.0 pg   MCHC 33.2 30.0 - 36.0 g/dL   RDW 85.2 88.4 - 84.4 %   Platelets 210 150 - 400 K/uL   nRBC 0.0 0.0 - 0.2 %    I&O: I/O last 3 completed shifts: In: 1000 [I.V.:1000] Out: 1850 [Urine:1400; Blood:450]   No intake/output data recorded.  Lungs: {Exam; lungs:5033}  Heart: {Exam; heart:5510}  Abdomen: {PE ABDOMEN POSTPARTUM OBGYN:313106}  Perineum: {exam; perineum:10172}  Lochia: ***  Extremities:{pe extremities na:685541}    A/P: POD # ***/PPD # ***/ [id number]  Doing well  Continue routine post partum orders  ***

## 2024-05-06 ENCOUNTER — Other Ambulatory Visit (HOSPITAL_COMMUNITY): Payer: Self-pay

## 2024-05-06 MED ORDER — OXYCODONE HCL 5 MG PO TABS: 5.0000 mg | ORAL_TABLET | ORAL | 0 refills | Status: DC | PRN

## 2024-05-06 MED ORDER — OXYCODONE HCL 5 MG PO TABS
5.0000 mg | ORAL_TABLET | ORAL | 0 refills | Status: AC | PRN
  Filled 2024-05-06: qty 30, 5d supply, fill #0

## 2024-05-06 MED ORDER — INFLUENZA VIRUS VACC SPLIT PF (FLUZONE) 0.5 ML IM SUSY
0.5000 mL | PREFILLED_SYRINGE | Freq: Once | INTRAMUSCULAR | Status: DC
Start: 2024-05-06 — End: 2024-05-07
  Filled 2024-05-06: qty 0.5

## 2024-05-06 MED ORDER — IBUPROFEN 600 MG PO TABS: 600.0000 mg | ORAL_TABLET | Freq: Four times a day (QID) | ORAL | 11 refills | Status: AC | PRN

## 2024-05-06 NOTE — Progress Notes (Signed)
 SVD: repeat  S:  Pt reports feeling well. Request early discharge / Tolerating po/ Voiding without problems/ No n/v/ Bleeding is light/ Pain controlled withacetaminophen, prescription NSAID's including ibuprofen  (Motrin ), and narcotic analgesics including oxycodone /ASA (Percodan)    O:  A & O x 3 / VS: Blood pressure 114/67, pulse (!) 106, temperature 98 F (36.7 C), temperature source Oral, resp. rate 17, height 5' 2 (1.575 m), weight 84.1 kg, SpO2 100%, unknown if currently breastfeeding.  LABS: No results found for this or any previous visit (from the past 24 hours).  I&O: I/O last 3 completed shifts: In: -  Out: 800 [Urine:800]   No intake/output data recorded.  Lungs: chest clear, no wheezing, rales, normal symmetric air entry  Heart: regular rate and rhythm  Abdomen: soft  uterus firm at umb. Primary dressing intact. Brown stain mid right  Perineum: not inspected  Lochia: light  Extremities:no redness or tenderness in the calves or thighs, edema 2 + R>>L    A/P: POD # 2/PPD # 2/ [id number] with known lymphedema  Doing well  Continue routine post partum orders  D/c instructions were reviewed F/u 6 wk  Script sent

## 2024-05-06 NOTE — Discharge Instructions (Signed)
 Call if temperature greater than equal to 100.4, nothing per vagina for 4-6 weeks or severe nausea vomiting, increased incisional pain , drainage or redness in the incision site, no straining with bowel movements, showers no bath

## 2024-05-06 NOTE — Social Work (Signed)
 MOB was referred for history of depression/anxiety.  * Referral screened out by Clinical Social Worker because none of the following criteria appear to apply:  ~ History of anxiety/depression during this pregnancy, or of post-partum depression following prior delivery.  ~ Diagnosis of anxiety and/or depression within last 3 years OR * MOB's symptoms currently being treated with medication and/or therapy.  Per chart review no MH concerns noted during this pregnancy, no diagnosis of anxiety noted. Edinburgh=0  Please contact the Clinical Social Worker if needs arise, or by MOB request.   Eliazar Gave, LCSWA Clinical Social Worker [phone]

## 2024-05-06 NOTE — Discharge Summary (Signed)
 Postpartum Discharge Summary  Date of Service updated     Patient Name: Helen Shah DOB: 22-Apr-1991 MRN: [id number]  Date of admission: 05/04/2024 Delivery date:05/04/2024 Delivering provider: Donaciano Range Date of discharge: 05/06/2024  Admitting diagnosis: [redacted] weeks gestation of pregnancy [Z3A.39] Previous cesarean section complicating pregnancy [O34.219] Postpartum care following cesarean delivery [Z39.2] Intrauterine pregnancy: [redacted]w[redacted]d     Secondary diagnosis:  Principal Problem:   Previous cesarean section complicating pregnancy Active Problems:   Postpartum care following cesarean delivery  Additional problems: early labor, term gestation, lymphedema    Discharge diagnosis: Term Pregnancy Delivered and previous cesarean section, early labor  lymphedema                                            Post partum procedures:n/a Augmentation: N/A Complications: None  Hospital course: Sceduled C/S   33 y.o. yo G3P3003 at [redacted]w[redacted]d was admitted to the hospital 05/04/2024 for scheduled cesarean section with the following indication:Elective Repeat. At the time of presentation, the patient was contracting every two hours and exam revealed cervix 3 cm/50/-3 station Delivery details are as follows:  Membrane Rupture Time/Date: 9:53 AM,05/04/2024  Delivery Method:C-Section, Low Transverse Operative Delivery:N/A Details of operation can be found in separate operative note.  Patient had a postpartum course complicated by none.  She is ambulating, tolerating a regular diet, passing flatus, and urinating well. Patient is discharged home in stable condition on  05/06/24        Newborn Data: Birth date:05/04/2024 Birth time:9:53 AM Gender:Female Living status:Living Apgars:8 ,9  Weight:2820 g    Magnesium Sulfate received: No BMZ received: No Rhophylac:No MMR:No T-DaP:Given prenatally Flu: Yes RSV Vaccine received: No Transfusion:No Immunizations administered: There is no immunization  history for the selected administration types on file for this patient.  Physical exam  Vitals:   05/05/24 0012 05/05/24 0602 05/05/24 1940 05/06/24 0530  BP: 125/60 124/72 126/69 114/67  Pulse: 98 91 97 (!) 106  Resp: 18 18 17 17   Temp: 98 F (36.7 C) 97.7 F (36.5 C) 97.7 F (36.5 C) 98 F (36.7 C)  TempSrc: Oral Oral Oral Oral  SpO2: 100% 100% 100% 100%  Weight:      Height:       General: alert, cooperative, and no distress Lochia: appropriate Uterine Fundus: firm Incision: Dressing is clean, dry, and intact DVT Evaluation: No evidence of DVT seen on physical exam. Calf/Ankle edema is present Labs: Lab Results  Component Value Date   WBC 16.0 (H) 05/05/2024   HGB 10.8 (L) 05/05/2024   HCT 32.5 (L) 05/05/2024   MCV 95.0 05/05/2024   PLT 210 05/05/2024       No data to display         Edinburgh Score:    05/06/2024   11:46 AM  Edinburgh Postnatal Depression Scale Screening Tool  I have been able to laugh and see the funny side of things. 0  I have looked forward with enjoyment to things. 0  I have blamed myself unnecessarily when things went wrong. 0  I have been anxious or worried for no good reason. 0  I have felt scared or panicky for no good reason. 0  Things have been getting on top of me. 0  I have been so unhappy that I have had difficulty sleeping. 0  I have felt sad or miserable. 0  I have been so unhappy that I have been crying. 0  The thought of harming myself has occurred to me. 0  Edinburgh Postnatal Depression Scale Total 0      After visit meds:  Allergies as of 05/06/2024   No Known Allergies      Medication List     TAKE these medications    acetaminophen  500 MG tablet Commonly known as: TYLENOL  Take 1,000 mg by mouth every 6 (six) hours as needed for moderate pain (pain score 4-6), mild pain (pain score 1-3) or headache (Hand pain).   ibuprofen  600 MG tablet Commonly known as: ADVIL  Take 1 tablet (600 mg total) by mouth every  6 (six) hours as needed.   multivitamin-prenatal 27-0.8 MG Tabs tablet Take 1 tablet by mouth daily at 12 noon.   oxyCODONE  5 MG immediate release tablet Commonly known as: Oxy IR/ROXICODONE  Take 1-2 tablets (5-10 mg total) by mouth every 4 (four) hours as needed for up to 7 days for moderate pain (pain score 4-6).         Discharge home in stable condition Infant Feeding: Breast Infant Disposition:home with mother Discharge instruction: per After Visit Summary and Postpartum booklet. Activity: Advance as tolerated. Pelvic rest for 6 weeks.  Diet: routine diet Anticipated Birth Control: Condoms Postpartum Appointment:6 weeks Additional Postpartum F/U: n/a Future Appointments:No future appointments. Follow up Visit:  Follow-up Information     Rutherford Gain, MD Follow up in 6 week(s).   Specialty: Obstetrics and Gynecology Contact information: [address] [place] [address] [phone]                     05/06/2024 Gain DELENA Rutherford, MD

## 2024-05-15 ENCOUNTER — Telehealth (HOSPITAL_COMMUNITY): Payer: Self-pay | Admitting: *Deleted

## 2024-05-15 NOTE — Telephone Encounter (Signed)
 Attempted hospital discharge follow-up call. Left message for patient to return RN call with any questions or concerns. Allean IVAR Carton, RN, 05/15/24, [phone]
